# Patient Record
Sex: Female | Born: 1960
Health system: Southern US, Community
[De-identification: ages and names within clinical notes are randomized; demographics above are authoritative.]

## PROBLEM LIST (undated history)

## (undated) DIAGNOSIS — M1990 Unspecified osteoarthritis, unspecified site: Secondary | ICD-10-CM

## (undated) DIAGNOSIS — E7800 Pure hypercholesterolemia, unspecified: Secondary | ICD-10-CM

## (undated) DIAGNOSIS — D649 Anemia, unspecified: Secondary | ICD-10-CM

## (undated) DIAGNOSIS — F32A Depression, unspecified: Secondary | ICD-10-CM

## (undated) DIAGNOSIS — F329 Major depressive disorder, single episode, unspecified: Secondary | ICD-10-CM

## (undated) DIAGNOSIS — I10 Essential (primary) hypertension: Secondary | ICD-10-CM

## (undated) DIAGNOSIS — E559 Vitamin D deficiency, unspecified: Secondary | ICD-10-CM

## (undated) DIAGNOSIS — H409 Unspecified glaucoma: Secondary | ICD-10-CM

## (undated) HISTORY — PX: BREAST BIOPSY: SHX20

## (undated) HISTORY — DX: Pure hypercholesterolemia, unspecified: E78.00

## (undated) HISTORY — PX: SPINE SURGERY: SHX786

## (undated) HISTORY — DX: Unspecified osteoarthritis, unspecified site: M19.90

## (undated) HISTORY — DX: Unspecified glaucoma: H40.9

## (undated) HISTORY — DX: Vitamin D deficiency, unspecified: E55.9

## (undated) HISTORY — DX: Essential (primary) hypertension: I10

## (undated) HISTORY — DX: Anemia, unspecified: D64.9

## (undated) HISTORY — DX: Major depressive disorder, single episode, unspecified: F32.9

## (undated) HISTORY — DX: Depression, unspecified: F32.A

## (undated) HISTORY — PX: TUBAL LIGATION: SHX77

---

## 1998-10-09 ENCOUNTER — Other Ambulatory Visit: Admission: RE | Admit: 1998-10-09 | Discharge: 1998-10-09 | Payer: Self-pay | Admitting: Family Medicine

## 2000-10-12 ENCOUNTER — Other Ambulatory Visit: Admission: RE | Admit: 2000-10-12 | Discharge: 2000-10-12 | Payer: Self-pay | Admitting: Obstetrics and Gynecology

## 2000-10-19 ENCOUNTER — Encounter: Payer: Self-pay | Admitting: Obstetrics and Gynecology

## 2000-10-19 ENCOUNTER — Ambulatory Visit (HOSPITAL_COMMUNITY): Admission: RE | Admit: 2000-10-19 | Discharge: 2000-10-19 | Payer: Self-pay | Admitting: *Deleted

## 2002-01-04 ENCOUNTER — Ambulatory Visit (HOSPITAL_COMMUNITY): Admission: RE | Admit: 2002-01-04 | Discharge: 2002-01-04 | Payer: Self-pay | Admitting: Obstetrics and Gynecology

## 2002-01-04 ENCOUNTER — Encounter: Payer: Self-pay | Admitting: Obstetrics and Gynecology

## 2009-08-08 ENCOUNTER — Ambulatory Visit: Payer: Self-pay | Admitting: Cardiology

## 2010-09-23 LAB — COMPREHENSIVE METABOLIC PANEL
Albumin: 4.2
BUN: 16 mg/dL (ref 4–21)
Calcium: 9.6 mg/dL
Chloride: 105 mmol/L
Glucose: 97 mg/dL
Potassium: 4.3 mmol/L
Total Protein: 6.9 g/dL

## 2011-03-19 ENCOUNTER — Encounter: Payer: Self-pay | Admitting: Gastroenterology

## 2011-03-19 ENCOUNTER — Ambulatory Visit (INDEPENDENT_AMBULATORY_CARE_PROVIDER_SITE_OTHER): Payer: BC Managed Care – PPO | Admitting: Gastroenterology

## 2011-03-19 VITALS — BP 140/89 | HR 97 | Temp 97.6°F | Ht 66.0 in | Wt 156.2 lb

## 2011-03-19 DIAGNOSIS — R197 Diarrhea, unspecified: Secondary | ICD-10-CM | POA: Insufficient documentation

## 2011-03-19 DIAGNOSIS — K625 Hemorrhage of anus and rectum: Secondary | ICD-10-CM

## 2011-03-19 DIAGNOSIS — K219 Gastro-esophageal reflux disease without esophagitis: Secondary | ICD-10-CM

## 2011-03-19 MED ORDER — SOD PICOSULFATE-MAG OX-CIT ACD 10-3.5-12 MG-GM-GM PO PACK: 1.0000 | PACK | Freq: Once | ORAL | Status: DC

## 2011-03-19 MED ORDER — HYDROCORTISONE ACETATE 25 MG RE SUPP: 25.0000 mg | Freq: Two times a day (BID) | RECTAL | Status: DC

## 2011-03-19 MED ORDER — OMEPRAZOLE 20 MG PO CPDR: 20.0000 mg | DELAYED_RELEASE_CAPSULE | Freq: Every day | ORAL | Status: DC

## 2011-03-19 NOTE — Patient Instructions (Signed)
Start taking Prilosec 20 mg, 30 minutes before the first meal of the day. Samples have been provided, and a prescription has been sent to your pharmacy.  Please collect the stool studies and return to the lab as soon as you can. If you have any fever, chills, worsening abdominal pain, please call our office.   I have sent a prescription for 7 days of suppositories to the pharmacy to help with rectal discomfort.   We have set you up for a colonoscopy and a tentative upper endoscopy with Dr. Jena Gauss in the near future.   Further recommendations to follow.

## 2011-03-19 NOTE — Assessment & Plan Note (Signed)
51 year old female with several week hx of postprandial diarrhea, associated with hematochezia. Notes lower abdominal cramping prior to loose stools. No recent abx, sick contacts, change in medications. Notes intermittent rectal pruritis. No prior TCS, no FH of colon cancer. Doubt infectious process, but we will obtain Cdiff PCR and culture. Proceed with TCS in near future for lower GI tract evaluation. Likely hematochezia related to benign anorectal source, but unable to exclude other etiology at this time.   ~Proceed with TCS with Dr. Jena Gauss in near future: the risks, benefits, and alternatives have been discussed with the patient in detail. The patient states understanding and desires to proceed. ~Anusol suppositories provided ~Cdiff PCR, culture

## 2011-03-19 NOTE — Progress Notes (Signed)
Referring Provider: Edilia Bo, PA Primary Care Physician:  Monica Lanius, PA, PA Primary Gastroenterologist:  Dr. Jena Gauss   Chief Complaint  Patient presents with  . Rectal Bleeding    wt loss    HPI:   Monica Elliott is a very pleasant 51 year old female who presents today at the request of Edilia Bo, [place], secondary to diarrhea, rectal bleeding, and weight loss. She reports onset of symptoms 2-3 weeks ago, with postprandial loose stools about 30-45 minutes after eating. Notes blood in toilet. Underlying nausea noted, intermittent emesis but no hematemesis. Lower abdominal cramping after eating, preceding bowel movement. No fever, chills, sick contacts, recent abx. Has city water.  Also notes intermittent reflux, no dysphagia. Reports approximately 5 lbs of wt loss since Jan, unintentional. Still has good appetite. Rare NSAIDs.   No prior colonoscopy, no upper EGD.   Labs from August reviewed: no anemia, LFTs, TSH normal. To be abstracted.   Past Medical History  Diagnosis Date  . Hypertension   . Hypercholesterolemia   . Anemia     states low iron    Past Surgical History  Procedure Date  . None     Current Outpatient Prescriptions  Medication Sig Dispense Refill  . ALPRAZolam (XANAX) 0.5 MG tablet Take 0.5 mg by mouth at bedtime as needed.      . citalopram (CELEXA) 20 MG tablet Take 20 mg by mouth daily.      . ramipril (ALTACE) 10 MG capsule Take 10 mg by mouth daily.      . simvastatin (ZOCOR) 40 MG tablet Take 40 mg by mouth every evening.      . traZODone (DESYREL) 50 MG tablet Take 50 mg by mouth at bedtime.      . Vitamin D, Ergocalciferol, (DRISDOL) 50000 UNITS CAPS Take 50,000 Units by mouth.      . hydrocortisone (ANUSOL-HC) 25 MG suppository Place 1 suppository (25 mg total) rectally every 12 (twelve) hours.  14 suppository  0  . omeprazole (PRILOSEC) 20 MG capsule Take 1 capsule (20 mg total) by mouth daily.  30 capsule  1    Allergies as of 03/19/2011   . (No Known Allergies)    Family History  Problem Relation Age of Onset  . Colon cancer Neg Hx     History   Social History  . Marital Status: Single    Spouse Name: N/A    Number of Children: N/A  . Years of Education: N/A   Occupational History  . Not on file.   Social History Main Topics  . Smoking status: Current Everyday Smoker -- 1.0 packs/day    Types: Cigarettes  . Smokeless tobacco: Not on file  . Alcohol Use: Yes     a glass of wine every other night  . Drug Use: No  . Sexually Active: Not on file   Other Topics Concern  . Not on file   Social History Narrative  . No narrative on file    Review of Systems: Gen: SEE HPI CV: Denies chest pain, heart palpitations, syncope, peripheral edema. Resp: Denies shortness of breath with rest, cough, wheezing GI: Denies dysphagia or odynophagia. Denies hematemesis, fecal incontinence, or jaundice.  GU : Denies urinary burning, urinary frequency, urinary incontinence.  MS: Denies joint pain, muscle weakness, cramps, limited movement Derm: Denies rash, itching, dry skin Psych: Denies depression, anxiety, confusion or memory loss  Heme: Denies bruising, bleeding, and enlarged lymph nodes.  Physical Exam: BP 140/89  Pulse  97  Temp(Src) 97.6 F (36.4 C) (Temporal)  Ht 5\' 6"  (1.676 m)  Wt 156 lb 3.2 oz (70.852 kg)  BMI 25.21 kg/m2  LMP 02/17/2011 General:   Alert and oriented. Well-developed, well-nourished, pleasant and cooperative. Head:  Normocephalic and atraumatic. Eyes:  Conjunctiva pink, sclera clear, no icterus.   Conjunctiva pink. Ears:  Normal auditory acuity. Nose:  No deformity, discharge,  or lesions. Mouth:  No deformity or lesions, mucosa pink and moist.  Neck:  Supple, without mass or thyromegaly. Lungs:  Clear to auscultation bilaterally, without wheezing, rales, or rhonchi.  Heart:  S1, S2 present without murmurs noted.  Abdomen:  +BS, soft, non-tender and non-distended. Without mass or HSM.  No rebound or guarding. No hernias noted. Rectal:  Deferred  Msk:  Symmetrical without gross deformities. Normal posture. Pulses:  Normal pulses noted. Extremities:  Without clubbing or edema. Neurologic:  Alert and  oriented x4;  grossly normal neurologically. Skin:  Intact, warm and dry without significant lesions or rashes Cervical Nodes:  No significant cervical adenopathy. Psych:  Alert and cooperative. Normal mood and affect.

## 2011-03-19 NOTE — Progress Notes (Signed)
Addended by: Cherene Julian D on: 03/19/2011 09:01 AM   Modules accepted: Orders

## 2011-03-19 NOTE — Assessment & Plan Note (Signed)
See diarrhea. 

## 2011-03-19 NOTE — Assessment & Plan Note (Signed)
Intermittent reflux noted. No dysphagia. Underlying nausea for past several weeks, intermittent emesis but no hematemesis. Wt loss of 5 lbs since Jan, still has good appetite. No epigastric pain. Will place empirically on a PPI, proceed with possible EGD at time of TCS. Pt aware.   Proceed with possible upper endoscopy at time of TCSwith Dr. Jena Gauss. The risks, benefits, and alternatives have been discussed in detail with patient. They have stated understanding and desire to proceed.  Prilosec 20 mg daily, rx sent to pharmacy.

## 2011-03-19 NOTE — Progress Notes (Signed)
Faxed to PCP

## 2011-03-31 ENCOUNTER — Encounter (HOSPITAL_COMMUNITY): Payer: Self-pay | Admitting: Pharmacy Technician

## 2011-03-31 LAB — STOOL CULTURE

## 2011-04-03 ENCOUNTER — Telehealth: Payer: Self-pay | Admitting: Gastroenterology

## 2011-04-03 NOTE — Telephone Encounter (Signed)
LMOVM with new arrival and procedure times- she will need to check in at 1:30

## 2011-04-06 ENCOUNTER — Ambulatory Visit (HOSPITAL_COMMUNITY)
Admission: RE | Admit: 2011-04-06 | Discharge: 2011-04-06 | Disposition: A | Discharge status: Home / Self Care | Payer: BC Managed Care – PPO | Source: Ambulatory Visit | Attending: Internal Medicine | Admitting: Internal Medicine

## 2011-04-06 ENCOUNTER — Encounter (HOSPITAL_COMMUNITY)
Admission: RE | Disposition: A | Discharge status: Home / Self Care | Payer: Self-pay | Source: Ambulatory Visit | Attending: Internal Medicine

## 2011-04-06 ENCOUNTER — Encounter (HOSPITAL_COMMUNITY): Payer: Self-pay

## 2011-04-06 DIAGNOSIS — R197 Diarrhea, unspecified: Secondary | ICD-10-CM

## 2011-04-06 DIAGNOSIS — K625 Hemorrhage of anus and rectum: Secondary | ICD-10-CM

## 2011-04-06 DIAGNOSIS — R198 Other specified symptoms and signs involving the digestive system and abdomen: Secondary | ICD-10-CM

## 2011-04-06 DIAGNOSIS — Z79899 Other long term (current) drug therapy: Secondary | ICD-10-CM | POA: Insufficient documentation

## 2011-04-06 DIAGNOSIS — D126 Benign neoplasm of colon, unspecified: Secondary | ICD-10-CM

## 2011-04-06 DIAGNOSIS — K5730 Diverticulosis of large intestine without perforation or abscess without bleeding: Secondary | ICD-10-CM

## 2011-04-06 DIAGNOSIS — I10 Essential (primary) hypertension: Secondary | ICD-10-CM | POA: Insufficient documentation

## 2011-04-06 DIAGNOSIS — K219 Gastro-esophageal reflux disease without esophagitis: Secondary | ICD-10-CM

## 2011-04-06 DIAGNOSIS — E7800 Pure hypercholesterolemia, unspecified: Secondary | ICD-10-CM | POA: Insufficient documentation

## 2011-04-06 DIAGNOSIS — R112 Nausea with vomiting, unspecified: Secondary | ICD-10-CM | POA: Insufficient documentation

## 2011-04-06 HISTORY — PX: COLONOSCOPY: SHX5424

## 2011-04-06 SURGERY — COLONOSCOPY
Anesthesia: Moderate Sedation

## 2011-04-06 MED ORDER — MEPERIDINE HCL 100 MG/ML IJ SOLN
INTRAMUSCULAR | Status: DC | PRN
  Administered 2011-04-06 (×2): 25 mg via INTRAVENOUS
  Administered 2011-04-06: 50 mg via INTRAVENOUS

## 2011-04-06 MED ORDER — STERILE WATER FOR IRRIGATION IR SOLN
Status: DC | PRN
  Administered 2011-04-06: 16:00:00

## 2011-04-06 MED ORDER — MIDAZOLAM HCL 5 MG/5ML IJ SOLN
INTRAMUSCULAR | Status: DC | PRN
  Administered 2011-04-06 (×2): 1 mg via INTRAVENOUS
  Administered 2011-04-06: 2 mg via INTRAVENOUS
  Administered 2011-04-06: 1 mg via INTRAVENOUS

## 2011-04-06 MED ORDER — MEPERIDINE HCL 100 MG/ML IJ SOLN
INTRAMUSCULAR | Status: AC
  Filled 2011-04-06: qty 2

## 2011-04-06 MED ORDER — SODIUM CHLORIDE 0.45 % IV SOLN
Freq: Once | INTRAVENOUS | Status: AC
  Administered 2011-04-06: 15:00:00 via INTRAVENOUS

## 2011-04-06 MED ORDER — MIDAZOLAM HCL 5 MG/5ML IJ SOLN
INTRAMUSCULAR | Status: AC
  Filled 2011-04-06: qty 10

## 2011-04-06 NOTE — Op Note (Signed)
[facility] [address] [place], [place]  [address]  COLONOSCOPY PROCEDURE REPORT  PATIENT:  Monica, Elliott  MR#:  [id number] BIRTHDATE:  1960/02/08, 50 yrs. old  GENDER:  female ENDOSCOPIST:  R. Roetta Sessions, MD FACP [facility] REF. BY:          Edilia Bo, PA-C PROCEDURE DATE:  04/06/2011 PROCEDURE:  Diagnostic colonoscopy  INDICATIONS:  Recent loose stools and rectal bleeding. Also recent nausea and vomiting.  Patient says nausea and GERD symptoms now well controlled with recent addition of Prilosec. No alarm symptoms. Will not perform EGD today per plan since patient is doing much better from the upper GI tract standpoint. Stool culture and C. difficile came back negative  INFORMED CONSENT:  The risks, benefits, alternatives and imponderables including but not limited to bleeding, perforation as well as the possibility of a missed lesion have been reviewed. The potential for biopsy, lesion removal, etc. have also been discussed.  Questions have been answered.  All parties agreeable. Please see the history and physical in the medical record for more information.  MEDICATIONS:   Versed 5 mg IV and Demerol 100 mg IV in divided doses  DESCRIPTION OF PROCEDURE:  After a digital rectal exam was performed, the EC-[id number] ([id number]) colonoscope was advanced from the anus through the rectum and colon to the area of the cecum, ileocecal valve and appendiceal orifice.  The cecum was deeply intubated.  These structures were well-seen and photographed for the record.  From the level of the cecum and ileocecal valve, the scope was slowly and cautiously withdrawn.  The mucosal surfaces were carefully surveyed utilizing scope tip deflection to facilitate fold flattening as needed.  The scope was pulled down into the rectum where a thorough examination including retroflexion was performed. <<PROCEDUREIMAGES>>  FINDINGS: Adequate preparation. Normal rectum. Densely  populated pancolonic diverticulosis; remainder of colonic mucosa appeared normal except for a single diminutive polyp at the ileocecal valve and in the sigmoid colon. Both of these polyp were removed with cold biopsy forcep technique  COMPLICATIONS:  None  CECAL WITHDRAWAL TIME: 12 minutes  IMPRESSION:  Colonic diverticulosis and colonic polyps-removed as described above  RECOMMENDATIONS:  Polyp and diverticulosis information provided. Follow up on pathology. Office visit with [place] to reassess her symptoms in                                                 months  ______________________________ R. Roetta Sessions, MD Caleen Essex  CC:  n. eSIGNED:   R. Roetta Sessions at 04/06/2011 04:45 PM  [place], Vernona Rieger, [phone]

## 2011-04-06 NOTE — H&P (View-Only) (Signed)
 Referring Provider: Angie Jones, PA Primary Care Physician:  JONES, ANGIE LAVONNE, PA, PA Primary Gastroenterologist:  Dr. Rourk   Chief Complaint  Patient presents with  . Rectal Bleeding    wt loss    HPI:   Ms. Monica Elliott is a very pleasant 50-year-old female who presents today at the request of Angie Jones, PA, secondary to diarrhea, rectal bleeding, and weight loss. She reports onset of symptoms 2-3 weeks ago, with postprandial loose stools about 30-45 minutes after eating. Notes blood in toilet. Underlying nausea noted, intermittent emesis but no hematemesis. Lower abdominal cramping after eating, preceding bowel movement. No fever, chills, sick contacts, recent abx. Has city water.  Also notes intermittent reflux, no dysphagia. Reports approximately 5 lbs of wt loss since Jan, unintentional. Still has good appetite. Rare NSAIDs.   No prior colonoscopy, no upper EGD.   Labs from August reviewed: no anemia, LFTs, TSH normal. To be abstracted.   Past Medical History  Diagnosis Date  . Hypertension   . Hypercholesterolemia   . Anemia     states low iron    Past Surgical History  Procedure Date  . None     Current Outpatient Prescriptions  Medication Sig Dispense Refill  . ALPRAZolam (XANAX) 0.5 MG tablet Take 0.5 mg by mouth at bedtime as needed.      . citalopram (CELEXA) 20 MG tablet Take 20 mg by mouth daily.      . ramipril (ALTACE) 10 MG capsule Take 10 mg by mouth daily.      . simvastatin (ZOCOR) 40 MG tablet Take 40 mg by mouth every evening.      . traZODone (DESYREL) 50 MG tablet Take 50 mg by mouth at bedtime.      . Vitamin D, Ergocalciferol, (DRISDOL) 50000 UNITS CAPS Take 50,000 Units by mouth.      . hydrocortisone (ANUSOL-HC) 25 MG suppository Place 1 suppository (25 mg total) rectally every 12 (twelve) hours.  14 suppository  0  . omeprazole (PRILOSEC) 20 MG capsule Take 1 capsule (20 mg total) by mouth daily.  30 capsule  1    Allergies as of 03/19/2011   . (No Known Allergies)    Family History  Problem Relation Age of Onset  . Colon cancer Neg Hx     History   Social History  . Marital Status: Single    Spouse Name: N/A    Number of Children: N/A  . Years of Education: N/A   Occupational History  . Not on file.   Social History Main Topics  . Smoking status: Current Everyday Smoker -- 1.0 packs/day    Types: Cigarettes  . Smokeless tobacco: Not on file  . Alcohol Use: Yes     a glass of wine every other night  . Drug Use: No  . Sexually Active: Not on file   Other Topics Concern  . Not on file   Social History Narrative  . No narrative on file    Review of Systems: Gen: SEE HPI CV: Denies chest pain, heart palpitations, syncope, peripheral edema. Resp: Denies shortness of breath with rest, cough, wheezing GI: Denies dysphagia or odynophagia. Denies hematemesis, fecal incontinence, or jaundice.  GU : Denies urinary burning, urinary frequency, urinary incontinence.  MS: Denies joint pain, muscle weakness, cramps, limited movement Derm: Denies rash, itching, dry skin Psych: Denies depression, anxiety, confusion or memory loss  Heme: Denies bruising, bleeding, and enlarged lymph nodes.  Physical Exam: BP 140/89  Pulse   97  Temp(Src) 97.6 F (36.4 C) (Temporal)  Ht 5' 6" (1.676 m)  Wt 156 lb 3.2 oz (70.852 kg)  BMI 25.21 kg/m2  LMP 02/17/2011 General:   Alert and oriented. Well-developed, well-nourished, pleasant and cooperative. Head:  Normocephalic and atraumatic. Eyes:  Conjunctiva pink, sclera clear, no icterus.   Conjunctiva pink. Ears:  Normal auditory acuity. Nose:  No deformity, discharge,  or lesions. Mouth:  No deformity or lesions, mucosa pink and moist.  Neck:  Supple, without mass or thyromegaly. Lungs:  Clear to auscultation bilaterally, without wheezing, rales, or rhonchi.  Heart:  S1, S2 present without murmurs noted.  Abdomen:  +BS, soft, non-tender and non-distended. Without mass or HSM.  No rebound or guarding. No hernias noted. Rectal:  Deferred  Msk:  Symmetrical without gross deformities. Normal posture. Pulses:  Normal pulses noted. Extremities:  Without clubbing or edema. Neurologic:  Alert and  oriented x4;  grossly normal neurologically. Skin:  Intact, warm and dry without significant lesions or rashes Cervical Nodes:  No significant cervical adenopathy. Psych:  Alert and cooperative. Normal mood and affect.   

## 2011-04-06 NOTE — Discharge Instructions (Addendum)
Colonoscopy Discharge Instructions  Read the instructions outlined below and refer to this sheet in the next few weeks. These discharge instructions provide you with general information on caring for yourself after you leave the hospital. Your doctor may also give you specific instructions. While your treatment has been planned according to the most current medical practices available, unavoidable complications occasionally occur. If you have any problems or questions after discharge, call Dr. Jena Gauss at [phone]. ACTIVITY  You may resume your regular activity, but move at a slower pace for the next 24 hours.   Take frequent rest periods for the next 24 hours.   Walking will help get rid of the air and reduce the bloated feeling in your belly (abdomen).   No driving for 24 hours (because of the medicine (anesthesia) used during the test).    Do not sign any important legal documents or operate any machinery for 24 hours (because of the anesthesia used during the test).  NUTRITION  Drink plenty of fluids.   You may resume your normal diet as instructed by your doctor.   Begin with a light meal and progress to your normal diet. Heavy or fried foods are harder to digest and may make you feel sick to your stomach (nauseated).   Avoid alcoholic beverages for 24 hours or as instructed.  MEDICATIONS  You may resume your normal medications unless your doctor tells you otherwise.  WHAT YOU CAN EXPECT TODAY  Some feelings of bloating in the abdomen.   Passage of more gas than usual.   Spotting of blood in your stool or on the toilet paper.  IF YOU HAD POLYPS REMOVED DURING THE COLONOSCOPY:  No aspirin products for 7 days or as instructed.   No alcohol for 7 days or as instructed.   Eat a soft diet for the next 24 hours.  FINDING OUT THE RESULTS OF YOUR TEST Not all test results are available during your visit. If your test results are not back during the visit, make an appointment  with your caregiver to find out the results. Do not assume everything is normal if you have not heard from your caregiver or the medical facility. It is important for you to follow up on all of your test results.  SEEK IMMEDIATE MEDICAL ATTENTION IF:  You have more than a spotting of blood in your stool.   Your belly is swollen (abdominal distention).   You are nauseated or vomiting.   You have a temperature over 101.   You have abdominal pain or discomfort that is severe or gets worse throughout the day.     Diverticulosis information provided. Office visit with [place] in 3 months to see how you're doingHigh Fiber Diet A high fiber diet changes your normal diet to include more whole grains, legumes, fruits, and vegetables. Changes in the diet involve replacing refined carbohydrates with unrefined foods. The calorie level of the diet is essentially unchanged. The Dietary Reference Intake (recommended amount) for adult males is 38 g per day. For adult females, it is 25 g per day. Pregnant and lactating women should consume 28 g of fiber per day. Fiber is the intact part of a plant that is not broken down during digestion. Functional fiber is fiber that has been isolated from the plant to provide a beneficial effect in the body. PURPOSE  Increase stool bulk.   Ease and regulate bowel movements.   Lower cholesterol.  INDICATIONS THAT YOU NEED MORE FIBER  Constipation and  hemorrhoids.   Uncomplicated diverticulosis (intestine condition) and irritable bowel syndrome.   Weight management.   As a protective measure against hardening of the arteries (atherosclerosis), diabetes, and cancer.  NOTE OF CAUTION If you have a digestive or bowel problem, ask your caregiver for advice before adding high fiber foods to your diet. Some of the following medical problems are such that a high fiber diet should not be used without consulting your caregiver:  Acute diverticulitis (intestine infection).    Partial small bowel obstructions.   Complicated diverticular disease involving bleeding, rupture (perforation), or abscess (boil, furuncle).   Presence of autonomic neuropathy (nerve damage) or gastric paresis (stomach cannot empty itself).  GUIDELINES FOR INCREASING FIBER  Start adding fiber to the diet slowly. A gradual increase of about 5 more grams (2 slices of whole-wheat bread, 2 servings of most fruits or vegetables, or 1 bowl of high fiber cereal) per day is best. Too rapid an increase in fiber may result in constipation, flatulence, and bloating.   Drink enough water and fluids to keep your urine clear or pale yellow. Water, juice, or caffeine-free drinks are recommended. Not drinking enough fluid may cause constipation.   Eat a variety of high fiber foods rather than one type of fiber.   Try to increase your intake of fiber through using high fiber foods rather than fiber pills or supplements that contain small amounts of fiber.   The goal is to change the types of food eaten. Do not supplement your present diet with high fiber foods, but replace foods in your present diet.  INCLUDE A VARIETY OF FIBER SOURCES  Replace refined and processed grains with whole grains, canned fruits with fresh fruits, and incorporate other fiber sources. White rice, white breads, and most bakery goods contain little or no fiber.   Brown whole-grain rice, buckwheat oats, and many fruits and vegetables are all good sources of fiber. These include: broccoli, Brussels sprouts, cabbage, cauliflower, beets, sweet potatoes, white potatoes (skin on), carrots, tomatoes, eggplant, squash, berries, fresh fruits, and dried fruits.   Cereals appear to be the richest source of fiber. Cereal fiber is found in whole grains and bran. Bran is the fiber-rich outer coat of cereal grain, which is largely removed in refining. In whole-grain cereals, the bran remains. In breakfast cereals, the largest amount of fiber is  found in those with "bran" in their names. The fiber content is sometimes indicated on the label.   You may need to include additional fruits and vegetables each day.   In baking, for 1 cup white flour, you may use the following substitutions:   1 cup whole-wheat flour minus 2 tbs.    cup white flour plus  cup whole-wheat flour.  Document Released: 01/19/2005 Document Revised: 01/08/2011 Document Reviewed: 11/27/2008 [facility] Patient Information 2012 [place], [place].Diverticulosis Diverticulosis is a common condition that develops when small pouches (diverticula) form in the wall of the colon. The risk of diverticulosis increases with age. It happens more often in people who eat a low-fiber diet. Most individuals with diverticulosis have no symptoms. Those individuals with symptoms usually experience abdominal pain, constipation, or loose stools (diarrhea). HOME CARE INSTRUCTIONS   Increase the amount of fiber in your diet as directed by your caregiver or dietician. This may reduce symptoms of diverticulosis.   Your caregiver may recommend taking a dietary fiber supplement.   Drink at least 6 to 8 glasses of water each day to prevent constipation.   Try not to strain  when you have a bowel movement.   Your caregiver may recommend avoiding nuts and seeds to prevent complications, although this is still an uncertain benefit.   Only take over-the-counter or prescription medicines for pain, discomfort, or fever as directed by your caregiver.  FOODS WITH HIGH FIBER CONTENT INCLUDE:  Fruits. Apple, peach, pear, tangerine, raisins, prunes.   Vegetables. Brussels sprouts, asparagus, broccoli, cabbage, carrot, cauliflower, romaine lettuce, spinach, summer squash, tomato, winter squash, zucchini.   Starchy Vegetables. Baked beans, kidney beans, lima beans, split peas, lentils, potatoes (with skin).   Grains. Whole wheat bread, brown rice, bran flake cereal, plain oatmeal, white rice, shredded  wheat, bran muffins.  SEEK IMMEDIATE MEDICAL CARE IF:   You develop increasing pain or severe bloating.   You have an oral temperature above 102 F (38.9 C), not controlled by medicine.   You develop vomiting or bowel movements that are bloody or black.   Diverticulosis and polyp information provided.  Office visit with [place] in 3 months. Document Released: 10/17/2003 Document Revised: 01/08/2011 Document Reviewed: 06/19/2009 [facility] Patient Information 2012 [place], [place].

## 2011-04-06 NOTE — Interval H&P Note (Signed)
History and Physical Interval Note:  04/06/2011 3:50 PM  Jennilee Demarco Xu  has presented today for surgery, with the diagnosis of rectal bleeding, wt loss  The various methods of treatment have been discussed with the patient and family. After consideration of risks, benefits and other options for treatment, the patient has consented to  Procedure(s) (LRB): COLONOSCOPY (N/A) ESOPHAGOGASTRODUODENOSCOPY (EGD) (N/A) as a surgical intervention .  The patients' history has been reviewed, patient examined, no change in status, stable for surgery.  I have reviewed the patients' chart and labs.  Questions were answered to the patient's satisfaction.     Eula Listen

## 2011-04-07 NOTE — Progress Notes (Signed)
Quick Note:  Negative.  How is pt doing since colonoscopy? ______

## 2011-04-08 ENCOUNTER — Encounter: Payer: Self-pay | Admitting: Internal Medicine

## 2011-04-08 NOTE — Progress Notes (Signed)
Quick Note:  Tried to call pt- LMOM ______ 

## 2011-04-09 NOTE — Progress Notes (Signed)
Quick Note:  Great. She will follow-up with Verlon Au in may. ______

## 2011-04-10 ENCOUNTER — Encounter (HOSPITAL_COMMUNITY): Payer: Self-pay | Admitting: Internal Medicine

## 2011-05-27 LAB — CBC WITH DIFFERENTIAL/PLATELET
HCT: 40 %
Hemoglobin: 13.5 g/dL (ref 12.0–16.0)
MCHC: 33.7
MCV: 92.1 fL
WBC: 5.8

## 2011-06-08 ENCOUNTER — Encounter: Payer: Self-pay | Admitting: Internal Medicine

## 2011-06-09 ENCOUNTER — Ambulatory Visit (INDEPENDENT_AMBULATORY_CARE_PROVIDER_SITE_OTHER): Payer: BC Managed Care – PPO | Admitting: Gastroenterology

## 2011-06-09 ENCOUNTER — Encounter: Payer: Self-pay | Admitting: Gastroenterology

## 2011-06-09 VITALS — BP 151/94 | HR 89 | Temp 97.2°F | Ht 67.0 in | Wt 149.0 lb

## 2011-06-09 DIAGNOSIS — K529 Noninfective gastroenteritis and colitis, unspecified: Secondary | ICD-10-CM | POA: Insufficient documentation

## 2011-06-09 DIAGNOSIS — R634 Abnormal weight loss: Secondary | ICD-10-CM

## 2011-06-09 DIAGNOSIS — R197 Diarrhea, unspecified: Secondary | ICD-10-CM

## 2011-06-09 MED ORDER — DICYCLOMINE HCL 10 MG PO CAPS: 10.0000 mg | ORAL_CAPSULE | Freq: Three times a day (TID) | ORAL | Status: DC

## 2011-06-09 NOTE — Patient Instructions (Signed)
Please take bentyl at least twice daily for diarrhea. Usually works best if you take 30 minutes before a meal. May take up to four times daily. Get your blood work done. Call in two weeks with progress report.   Chronic Diarrhea Diarrhea is loose, watery stools. Having diarrhea means passing loose stools 3 or more times a day. Diarrhea that lasts longer than 4 weeks is considered long-lasting (chronic). Symptoms of chronic diarrhea may be continual or may come and go. People of all ages can get diarrhea. Body fluid loss (dehydration) may occur as a result of diarrhea. This means the body does not have as many fluids and salts (electrolytes) as it needs. CAUSES  There are many causes of chronic diarrhea. Causes may be different for children and adults. The various causes can be grouped into 2 categories: diarrhea caused by an infection and diarrhea not caused by an infection. Sometimes, the cause is unknown. Diarrhea caused by an infection may result from:  Parasites.   Bacteria.   Viral infections.  Diarrhea not caused by an infection may result from:  Irritable bowel syndrome.   Reaction to medicines, such as antibiotics, cancer drugs, blood pressure medicines, and antacids.   Intestinal disease (Crohn's disease, ulcerative colitis, celiac disease).   Food allergies or sensitivity to additives (fructose, lactose, sugar substitutes).   Tumors.   Diabetes, thyroid disease, and other endocrine diseases.   Reduced blood flow to the intestine.   Previous surgery or radiation of the abdomen or gastrointestinal tract.  Risk factors for chronic diarrhea include:  Having a severely weakened immune system, such as from HIV/AIDS.   Taking certain types of cancer-fighting drugs (chemotherapy) or other medicines.   A recent organ transplant.   Having a portion of the stomach removed.   Traveling to countries where food and water supplies are often contaminated.  SYMPTOMS  In addition  to frequent, loose stools, diarrhea may cause:  Cramping.   Abdominal pain.   Nausea.   Urgent need to use the bathroom, or loss of bowel control.  If dehydration occurs, problems include:  Thirst.   Less frequent urination.   Dark urine.   Dry skin.   Fatigue.   Dizziness.  Infections that cause diarrhea may also cause a fever, chills, or bloody stools. DIAGNOSIS  Diagnosis may be difficult. Your caregiver must take a careful history and perform a physical exam. Tests given are based on your symptoms and history. Tests may include:  Blood or stool tests, in which 3 or more stool samples may be examined. Stool cultures may be used to test for bacteria or parasites.   X-rays.   A procedure in which a thin tube is inserted into the mouth or rectum (endoscopy). This allows the caregiver to look inside the intestine.  TREATMENT   Diarrhea caused by an infection can often be treated with antibiotics.   Diarrhea not caused by an infection is more difficult to diagnose and treat. Long-term medicine use or surgery may be required. Specific treatment should be discussed with your caregiver.   If the cause cannot determined, treatment to relieve symptoms includes:   Preventing dehydration. Serious health problems can occur if you do not maintain proper fluid levels. Many oral rehydration solutions (ORS) are available at drug stores. Ask your caregiver what product is best for you.   Not drinking beverages that contain caffeine (tea, coffee, soft drinks).   Not drinking alcohol. It causes dehydration.   Not relying on sports drinks  and broths alone to maintain proper fluid levels. They should not be used to prevent severe dehydration.   Maintaining well-balanced nutrition. This may help you recover faster.  PREVENTION   Drink clean or purified water.   Use proper food handling techniques.   Maintain proper hand-washing habits.  HOME CARE INSTRUCTIONS   Avoid:    Caffeine.   Greasy foods.   High fiber.   If you have problems digesting lactose during or after an episode of diarrhea, you might want to try yogurt. Yogurt is often better tolerated, because it has less lactose than milk. Yogurt with active, live bacterial cultures may even help you recover faster.  SEEK MEDICAL CARE IF:  The person with diarrhea is an otherwise healthy adult and has:  Signs of dehydration.   Diarrhea for more than 2 days.   Severe pain in the abdomen or rectum.   An oral temperature above 102 F (38.9 C).   Stools containing blood or pus.   Stools that are black and tarry.  SEEK IMMEDIATE MEDICAL CARE IF:  The person with diarrhea is a child, elderly person, or has a weakened immune system and has:  Signs of dehydration.   Diarrhea for more than 1 day.   Severe pain in the abdomen or rectum.   An oral temperature above 102 F (38.9 C), not controlled by medicine.   Stools containing blood or pus.   Stools that are black and tarry.  Document Released: 04/11/2003 Document Revised: 01/08/2011 Document Reviewed: 06/07/2009 [facility] Patient Information 2012 [place], [place].

## 2011-06-09 NOTE — Progress Notes (Signed)
Faxed to PCP

## 2011-06-09 NOTE — Assessment & Plan Note (Addendum)
Four month h/o diarrhea with associated weight loss. We need to rule out celiac disease. If serologies are negative, would consider flex sig with biopsies to rule out microscopic colitis and/or send stool for giardia.   Add bentyl. PR in two weeks.

## 2011-06-09 NOTE — Progress Notes (Signed)
Primary Care Physician: Lajean Saver, NT, Nursing/ Tech I  Primary Gastroenterologist:  Roetta Sessions, MD   Chief Complaint  Patient presents with  . Follow-up    still has diarrhea at times.     HPI: Monica Elliott is a 51 y.o. female here for f/u of diarrhea. Seen in 03/2011. Had TCS in 04/2011, see below for findings.  Several months of pp loose stools. Begin back in 02/2011. Stool culture, CDiff negative at that time. TSH, CBC normal. Patient taking digestive enzymes which help stool consistency. Still having 4+ stool daily and nocturnal BMs at times. Bleeding has cleared up. Heartburn doing okay. Weight down from 163 to 149. Last OV in 03/2011, weight was 156. Menstrual cycles different last month, more of spotting for several weeks, usually heavy menses. Denies abdominal pain, dysuria.     Current Outpatient Prescriptions  Medication Sig Dispense Refill  . ALPRAZolam (XANAX) 0.5 MG tablet Take 0.5 mg by mouth at bedtime as needed. For sleep      . Cholecalciferol (VITAMIN D PO) Take 1 tablet by mouth daily.      . citalopram (CELEXA) 20 MG tablet Take 20 mg by mouth daily.      Marland Kitchen DIGESTIVE ENZYMES PO Take 4 tablets by mouth daily.      . IRON PO Take 1 tablet by mouth daily.      . naproxen sodium (ANAPROX) 220 MG tablet Take 220 mg by mouth every 8 (eight) hours as needed. For pain      . omeprazole (PRILOSEC) 20 MG capsule Take 1 capsule (20 mg total) by mouth daily.  30 capsule  1  . ramipril (ALTACE) 10 MG capsule Take 10 mg by mouth daily.      . simvastatin (ZOCOR) 40 MG tablet Take 40 mg by mouth every evening.      . traZODone (DESYREL) 50 MG tablet Take 50 mg by mouth at bedtime.        Allergies as of 06/09/2011  . (No Known Allergies)   Past Surgical History  Procedure Date  . Tubal ligation   . Colonoscopy 04/06/2011    Colonic diverticulosis and colonic polyps-removed as described above. Polyps benign. Next colonoscopy 04/2021.     ROS:  General: Negative for  anorexia, fever, chills, fatigue, weakness. See hpi for wt loss. ENT: Negative for hoarseness, difficulty swallowing , nasal congestion. CV: Negative for chest pain, angina, palpitations, dyspnea on exertion, peripheral edema.  Respiratory: Negative for dyspnea at rest, dyspnea on exertion, cough, sputum, wheezing.  GI: See history of present illness. GU:  Negative for dysuria, hematuria, urinary incontinence, urinary frequency, nocturnal urination.  Endo: see hpi for wt loss   Physical Examination:   Pulse 89  Temp(Src) 97.2 F (36.2 C) (Temporal)  Ht 5\' 7"  (1.702 m)  Wt 149 lb (67.586 kg)  BMI 23.34 kg/m2  LMP 05/10/2011  General: Well-nourished, well-developed in no acute distress.  Eyes: No icterus. Mouth: Oropharyngeal mucosa moist and pink , no lesions erythema or exudate. Lungs: Clear to auscultation bilaterally.  Heart: Regular rate and rhythm, no murmurs rubs or gallops.  Abdomen: Bowel sounds are normal, nontender, nondistended, no hepatosplenomegaly or masses, no abdominal bruits or hernia , no rebound or guarding.   Extremities: No lower extremity edema. No clubbing or deformities. Neuro: Alert and oriented x 4   Skin: Warm and dry, no jaundice.   Psych: Alert and cooperative, normal mood and affect.

## 2011-06-15 LAB — TISSUE TRANSGLUTAMINASE, IGA: Tissue Transglutaminase Ab, IgA: 3.5 U/mL (ref <20)

## 2011-06-17 NOTE — Progress Notes (Signed)
Quick Note:    noted  ______

## 2011-06-17 NOTE — Progress Notes (Signed)
Quick Note:  No evidence of celiac disease. How is she doing on bentyl? If persistent diarrhea, recommend stool for giardia prior to consider flex sig with bx. ______

## 2014-02-02 HISTORY — PX: BACK SURGERY: SHX140

## 2014-07-31 ENCOUNTER — Encounter: Payer: Self-pay | Admitting: Physical Therapy

## 2014-07-31 ENCOUNTER — Ambulatory Visit: Payer: 59 | Attending: Physician Assistant | Admitting: Physical Therapy

## 2014-07-31 DIAGNOSIS — M5416 Radiculopathy, lumbar region: Secondary | ICD-10-CM | POA: Insufficient documentation

## 2014-07-31 DIAGNOSIS — R531 Weakness: Secondary | ICD-10-CM | POA: Insufficient documentation

## 2014-07-31 DIAGNOSIS — Z9889 Other specified postprocedural states: Secondary | ICD-10-CM | POA: Diagnosis not present

## 2014-07-31 DIAGNOSIS — M5126 Other intervertebral disc displacement, lumbar region: Secondary | ICD-10-CM | POA: Insufficient documentation

## 2014-07-31 NOTE — Patient Instructions (Signed)
  Quadriceps (Prone)   On stomach with sheet around ankles, knees together, hips down, pull heels toward bottom. Keep hips flat. Hold _30___ seconds. Repeat __3__ times. Do _2___ sessions per day. CAUTION: Stretch should be gentle, steady and slow.  Copyright  VHI. All rights reserved.   Hip Flexion / Knee Extension: Straight-Leg Raise (Eccentric)   Lie on back. Lift leg with knee straight. Slowly lower leg for 3-5 seconds. __10_ reps per set, __1-3_ sets per day  ABDUCTION: Side-Lying (Active)   Lie on left side, top leg straight. Raise top leg as far as possible. Use 0___ lbs. Complete _1-3__ sets of _10__ repetitions. Perform _1__ sessions per day.  http://gtsc.exer.us/94   (Home) Extension: Hip   With support under abdomen, tighten stomach. Lift right leg in line with body. Do not hyperextend. Alternate legs. Repeat __10__ times per set. Do __1-3__ sets per session. Do __1__ sessions per day.  Madelyn Flavors, PT 07/31/2014 11:09 AM [facility] [address] [place], [place], [address] Phone: [phone]   Fax:  [phone]

## 2014-07-31 NOTE — Therapy (Signed)
[facility] [place], [place], [address] Phone: [phone]   Fax:  [phone]  Physical Therapy Evaluation  Patient Details  Name: Monica Elliott MRN: [id number] Date of Birth: 03/30/1960 Referring Provider:  Terald Sleeper, PA-C  Encounter Date: 07/31/2014      PT End of Session - 07/31/14 1112    Visit Number 1   Number of Visits 8   Date for PT Re-Evaluation 08/28/14   PT Start Time 8366   PT Stop Time 1112   PT Time Calculation (min) 35 min   Activity Tolerance Patient tolerated treatment well   Behavior During Therapy [facility] for tasks assessed/performed      Past Medical History  Diagnosis Date  . Hypertension   . Hypercholesterolemia   . Anemia     states low iron    Past Surgical History  Procedure Laterality Date  . Tubal ligation    . Colonoscopy  04/06/2011    Colonic diverticulosis and colonic polyps-removed as described above. Polyps benign. Next colonoscopy 04/2021.    There were no vitals filed for this visit.  Visit Diagnosis:  Generalized weakness - Plan: PT plan of care cert/re-cert      Subjective Assessment - 07/31/14 1044    Subjective Patient had surgery in March 26, 2014 for ruptured disc. She reports only minor intermittent pain with back but still has weakness in her right leg. Also reports intermittent tingling and numbness in her right foot, mainly when barefoot.    Patient Stated Goals to get stronger   Currently in Pain? No/denies            [facility] PT Assessment - 07/31/14 0001    Assessment   Medical Diagnosis lumbar nerve root impingement; disc displacement   Onset Date/Surgical Date 03/26/14   Next MD Visit 09/24/14   Precautions   Precaution Comments 15# weight lifting restriction   Restrictions   Weight Bearing Restrictions No   Balance Screen   Has the patient fallen in the past 6 months No   Has the patient had a decrease in activity level because of a fear of  falling?  No   Is the patient reluctant to leave their home because of a fear of falling?  No   Prior Function   Level of Independence Independent   Vocation Full time employment  cleared for light duty   Vocation Requirements standing  works in [place] / Strength   AROM / PROM / Strength AROM;Strength   AROM   Overall AROM Comments Lumbar WFL except flex 18" from floor and left rot decreased 30%.   Strength   Overall Strength Comments Left hip flex, ext and ABD  4-/5 bil, bil knee flex 4+/5, ext 5/5; DF 5/5   Flexibility   Soft Tissue Assessment /Muscle Length --  tight HF bil   Palpation   Palpation comment mild tenderness right lumbar muscles                           PT Education - 07/31/14 1222    Education provided Yes   Education Details prone quad stretch, 3 way SLR lying down   Person(s) Educated Patient   Methods Explanation;Demonstration;Handout   Comprehension Verbalized understanding;Returned demonstration             PT Long Term Goals - 07/31/14 1227    PT LONG TERM GOAL #1  Title I with HEP   Time 4   Period Weeks   Status New   PT LONG TERM GOAL #2   Title improved BLE strength to 4+/5 or better   Time 4   Period Weeks   PT LONG TERM GOAL #3   Title demo and verbalize understanding of correct body mechanics to prevent further injury to back   Time 4   Period Weeks   Status New   PT LONG TERM GOAL #4   Title demo good core (abdominal) control with therex in clinic   Time 4   Period Weeks   Status New               Plan - 07/31/14 1223    Clinical Impression Statement Patient is a 54 year old female who underwent lumbar discectomy 03/26/14. She now has minimal pain, but weakness in BLE and core. Patient works at [employer] and needs to improve her strength to return to work. She also reports intermittent N/T in right foot.    Pt will benefit from skilled therapeutic intervention in order to improve on  the following deficits Decreased range of motion;Decreased activity tolerance;Pain;Impaired flexibility;Decreased strength   Rehab Potential Excellent   PT Frequency 2x / week   PT Duration 4 weeks   PT Treatment/Interventions ADLs/Self Care Home Management;Cryotherapy;Electrical Stimulation;Moist Heat;Therapeutic exercise;Neuromuscular re-education;Patient/family education;Manual techniques;Passive range of motion   PT Next Visit Plan core and BLE strengthening   Consulted and Agree with Plan of Care Patient         Problem List Patient Active Problem List   Diagnosis Date Noted  . Abnormal weight loss 06/09/2011  . Chronic diarrhea 06/09/2011  . Diarrhea 03/19/2011  . Rectal bleeding 03/19/2011  . GERD (gastroesophageal reflux disease) 03/19/2011    Madelyn Flavors PT  07/31/2014, 12:33 PM  [facility] [address] [place], [place], [address] Phone: [phone]   Fax:  [phone]

## 2014-08-03 ENCOUNTER — Ambulatory Visit: Payer: 59 | Attending: Physician Assistant | Admitting: Physical Therapy

## 2014-08-03 ENCOUNTER — Encounter: Payer: Self-pay | Admitting: Physical Therapy

## 2014-08-03 DIAGNOSIS — R531 Weakness: Secondary | ICD-10-CM | POA: Insufficient documentation

## 2014-08-03 NOTE — Therapy (Signed)
[facility] [place], [place], [address] Phone: [phone]   Fax:  [phone]  Physical Therapy Treatment  Patient Details  Name: Monica Elliott MRN: [id number] Date of Birth: 1961/01/05 Referring Provider:  Terald Sleeper, PA-C  Encounter Date: 08/03/2014      PT End of Session - 08/03/14 1033    Visit Number 2   Number of Visits 8   Date for PT Re-Evaluation 08/28/14   PT Start Time 1031   PT Stop Time 1114   PT Time Calculation (min) 43 min   Activity Tolerance Patient tolerated treatment well   Behavior During Therapy [facility] for tasks assessed/performed      Past Medical History  Diagnosis Date  . Hypertension   . Hypercholesterolemia   . Anemia     states low iron    Past Surgical History  Procedure Laterality Date  . Tubal ligation    . Colonoscopy  04/06/2011    Colonic diverticulosis and colonic polyps-removed as described above. Polyps benign. Next colonoscopy 04/2021.    There were no vitals filed for this visit.  Visit Diagnosis:  Generalized weakness      Subjective Assessment - 08/03/14 1033    Subjective States that her RLE is the worst of her LEs.   Patient Stated Goals to get stronger   Currently in Pain? No/denies            [facility] PT Assessment - 08/03/14 0001    Assessment   Medical Diagnosis lumbar nerve root impingement; disc displacement   Onset Date/Surgical Date 03/26/14   Next MD Visit 09/24/14                     [facility] Adult PT Treatment/Exercise - 08/03/14 0001    Exercises   Exercises Knee/Hip   Knee/Hip Exercises: Aerobic   Nustep L4 x16 min   Knee/Hip Exercises: Standing   Wall Squat 2 sets;10 reps;Other (comment)  With verbal cueing for core activation   Other Standing Knee Exercises 4 way XTS walks x5 reps each; Pink   Knee/Hip Exercises: Seated   Long Arc Quad Strengthening;Both;3 sets;10 reps;Weights  1 set 10 reps 3 #, 2 sets 10 reps 5#   Long  Arc Quad Weight 5 lbs.   Knee/Hip Exercises: Supine   Bridges Strengthening;Both;3 sets;10 reps   Straight Leg Raises Strengthening;Both;3 sets;10 reps  with verbal cueing for core activation   Knee/Hip Exercises: Sidelying   Hip ABduction Strengthening;Both;3 sets;10 reps  3#   Knee/Hip Exercises: Prone   Hamstring Curl 3 sets;10 reps;Other (comment)  2#   Hip Extension Strengthening;Both;3 sets;10 reps                     PT Long Term Goals - 08/03/14 1101    PT LONG TERM GOAL #1   Title I with HEP   Time 4   Period Weeks   Status Achieved   PT LONG TERM GOAL #2   Title improved BLE strength to 4+/5 or better   Time 4   Period Weeks   Status On-going   PT LONG TERM GOAL #3   Title demo and verbalize understanding of correct body mechanics to prevent further injury to back   Time 4   Period Weeks   Status On-going   PT LONG TERM GOAL #4   Title demo good core (abdominal) control with therex in clinic   Time 4   Period  Weeks   Status On-going               Plan - 08/03/14 1117    Clinical Impression Statement Patient tolerated treatment well today without complaint of pain. Demonstrates greater difficulty with R hip abduction, although patient reported the L SLR was more difficult than R SLR. Achieved HEP goal today in clinic. All others goal remain on-going secondary to decreased pain. Denied pain following treatment only fatigue.   Pt will benefit from skilled therapeutic intervention in order to improve on the following deficits Decreased range of motion;Decreased activity tolerance;Pain;Impaired flexibility;Decreased strength   Rehab Potential Excellent   PT Frequency 2x / week   PT Duration 4 weeks   PT Treatment/Interventions ADLs/Self Care Home Management;Cryotherapy;Electrical Stimulation;Moist Heat;Therapeutic exercise;Neuromuscular re-education;Patient/family education;Manual techniques;Passive range of motion   PT Next Visit Plan Continue  POC with core and BLE strengthening.   Consulted and Agree with Plan of Care Patient        Problem List Patient Active Problem List   Diagnosis Date Noted  . Abnormal weight loss 06/09/2011  . Chronic diarrhea 06/09/2011  . Diarrhea 03/19/2011  . Rectal bleeding 03/19/2011  . GERD (gastroesophageal reflux disease) 03/19/2011    Wynelle Fanny, PTA 08/03/2014, 11:21 AM  [facility] [address] [place], [place], [address] Phone: [phone]   Fax:  [phone]

## 2014-08-08 ENCOUNTER — Ambulatory Visit: Payer: 59 | Admitting: Physical Therapy

## 2014-08-08 ENCOUNTER — Encounter: Payer: Self-pay | Admitting: Physical Therapy

## 2014-08-08 DIAGNOSIS — R531 Weakness: Secondary | ICD-10-CM

## 2014-08-08 NOTE — Therapy (Signed)
[facility] [place], [place], [address] Phone: [phone]   Fax:  [phone]  Physical Therapy Treatment  Patient Details  Name: Monica Elliott MRN: [id number] Date of Birth: [birth year] Referring Provider:  Terald Sleeper, PA-C  Encounter Date: 08/08/2014      PT End of Session - 08/08/14 1021    Visit Number 3   Number of Visits 8   Date for PT Re-Evaluation 08/28/14   PT Start Time 0944   PT Stop Time 1028   PT Time Calculation (min) 44 min   Activity Tolerance Patient tolerated treatment well   Behavior During Therapy [facility] for tasks assessed/performed      Past Medical History  Diagnosis Date  . Hypertension   . Hypercholesterolemia   . Anemia     states low iron    Past Surgical History  Procedure Laterality Date  . Tubal ligation    . Colonoscopy  04/06/2011    Colonic diverticulosis and colonic polyps-removed as described above. Polyps benign. Next colonoscopy 04/2021.    There were no vitals filed for this visit.  Visit Diagnosis:  Generalized weakness      Subjective Assessment - 08/08/14 1003    Subjective States that her RLE is the worst of her LEs.   Patient Stated Goals to get stronger   Currently in Pain? No/denies                         [facility] Adult PT Treatment/Exercise - 08/08/14 0001    Exercises   Exercises Lumbar   Lumbar Exercises: Machines for Strengthening   Cybex Knee Extension 10# 3x10   Cybex Knee Flexion 20# 3x10   Lumbar Exercises: Standing   Other Standing Lumbar Exercises scap retractions/ext with pink XTS draw in focus   Lumbar Exercises: Supine   Bent Knee Raise 20 reps;3 seconds  with core activation   Bridge 20 reps   Straight Leg Raise 20 reps;3 seconds  with core activation   Knee/Hip Exercises: Aerobic   Nustep L5 x 73mn with posture focus   Knee/Hip Exercises: Standing   Lateral Step Up Both;2 sets;10 reps   Forward Step Up Both;2 sets;10  reps                     PT Long Term Goals - 08/08/14 1022    PT LONG TERM GOAL #1   Title I with HEP   Time 4   Period Weeks   Status Achieved   PT LONG TERM GOAL #2   Title improved BLE strength to 4+/5 or better   Time 4   Period Weeks   Status On-going   PT LONG TERM GOAL #3   Title demo and verbalize understanding of correct body mechanics to prevent further injury to back   Time 4   Period Weeks   Status Achieved   PT LONG TERM GOAL #4   Title demo good core (abdominal) control with therex in clinic   Time 4   Period Weeks   Status On-going               Plan - 08/08/14 1024    Clinical Impression Statement Patient continues to progress with all activies and has a very good understanding of posture awareness techniques in all positions. Patient tolerated treatment well today and performed exercises with good core activation. Met LTG #3 today, others ongoing due to LE  strength deficits.   Pt will benefit from skilled therapeutic intervention in order to improve on the following deficits Decreased range of motion;Decreased activity tolerance;Pain;Impaired flexibility;Decreased strength   Rehab Potential Excellent   PT Frequency 2x / week   PT Duration 4 weeks   PT Treatment/Interventions ADLs/Self Care Home Management;Cryotherapy;Electrical Stimulation;Moist Heat;Therapeutic exercise;Neuromuscular re-education;Patient/family education;Manual techniques;Passive range of motion   PT Next Visit Plan Continue POC with core and BLE strengthening.   Consulted and Agree with Plan of Care Patient        Problem List Patient Active Problem List   Diagnosis Date Noted  . Abnormal weight loss 06/09/2011  . Chronic diarrhea 06/09/2011  . Diarrhea 03/19/2011  . Rectal bleeding 03/19/2011  . GERD (gastroesophageal reflux disease) 03/19/2011    DUNFORD, CHRISTINA P, PTA 08/08/2014, 10:30 AM  [facility] [place], [place], [address] Phone: [phone]   Fax:  [phone]

## 2014-08-10 ENCOUNTER — Encounter: Payer: Self-pay | Admitting: Physical Therapy

## 2014-08-10 ENCOUNTER — Ambulatory Visit: Payer: 59 | Admitting: Physical Therapy

## 2014-08-10 DIAGNOSIS — R531 Weakness: Secondary | ICD-10-CM | POA: Diagnosis not present

## 2014-08-10 NOTE — Therapy (Signed)
[facility] [place], [place], [address] Phone: [phone]   Fax:  [phone]  Physical Therapy Treatment  Patient Details  Name: Monica Elliott Barnhard MRN: [id number] Date of Birth: 24-Apr-1960 Referring Provider:  Terald Sleeper, PA-C  Encounter Date: 08/10/2014      PT End of Session - 08/10/14 1024    Visit Number 4   Number of Visits 8   Date for PT Re-Evaluation 08/28/14   PT Start Time 0946   PT Stop Time 1027   PT Time Calculation (min) 41 min   Activity Tolerance Patient tolerated treatment well   Behavior During Therapy [facility] for tasks assessed/performed      Past Medical History  Diagnosis Date  . Hypertension   . Hypercholesterolemia   . Anemia     states low iron    Past Surgical History  Procedure Laterality Date  . Tubal ligation    . Colonoscopy  04/06/2011    Colonic diverticulosis and colonic polyps-removed as described above. Polyps benign. Next colonoscopy 04/2021.    There were no vitals filed for this visit.  Visit Diagnosis:  Generalized weakness      Subjective Assessment - 08/10/14 0948    Subjective States that her RLE is the worst of her LEs/ no complaints after last treatment   Patient Stated Goals to get stronger   Currently in Pain? No/denies                         [facility] Adult PT Treatment/Exercise - 08/10/14 0001    Lumbar Exercises: Standing   Other Standing Lumbar Exercises scap retractions/ext with pink XTS draw in focus   Knee/Hip Exercises: Aerobic   Elliptical L1 R1 x 2 1/68min   Nustep L5 x 65min with posture focus   Knee/Hip Exercises: Machines for Strengthening   Cybex Knee Extension 10# 3x10   Cybex Knee Flexion 30# 3x10   Knee/Hip Exercises: Standing   Lateral Step Up Both;2 sets;10 reps   Forward Step Up Both;2 sets;10 reps   Knee/Hip Exercises: Sidelying   Hip ABduction Strengthening;Both;2 sets;10 reps   Clams 4# 2x10 each LE                      PT Long Term Goals - 08/08/14 1022    PT LONG TERM GOAL #1   Title I with HEP   Time 4   Period Weeks   Status Achieved   PT LONG TERM GOAL #2   Title improved BLE strength to 4+/5 or better   Time 4   Period Weeks   Status On-going   PT LONG TERM GOAL #3   Title demo and verbalize understanding of correct body mechanics to prevent further injury to back   Time 4   Period Weeks   Status Achieved   PT LONG TERM GOAL #4   Title demo good core (abdominal) control with therex in clinic   Time 4   Period Weeks   Status On-going               Plan - 08/10/14 1025    Clinical Impression Statement Patient continues to progress with LE and core strngthening activities. Patient has good technique and tolerace with exercises. Progressed with elliptical and will continue per patient tolerace. Goals ongoing due to strength limitations.   Pt will benefit from skilled therapeutic intervention in order to improve on the following deficits  Decreased range of motion;Decreased activity tolerance;Pain;Impaired flexibility;Decreased strength   Rehab Potential Excellent   PT Frequency 2x / week   PT Duration 4 weeks   PT Treatment/Interventions ADLs/Self Care Home Management;Cryotherapy;Electrical Stimulation;Moist Heat;Therapeutic exercise;Neuromuscular re-education;Patient/family education;Manual techniques;Passive range of motion   PT Next Visit Plan Continue POC with core and BLE strengthening.        Problem List Patient Active Problem List   Diagnosis Date Noted  . Abnormal weight loss 06/09/2011  . Chronic diarrhea 06/09/2011  . Diarrhea 03/19/2011  . Rectal bleeding 03/19/2011  . GERD (gastroesophageal reflux disease) 03/19/2011    Ernisha Sorn P, PTA 08/10/2014, 10:27 AM  [facility] [address] [place], [place], [address] Phone: [phone]   Fax:  [phone]

## 2014-08-15 ENCOUNTER — Encounter: Payer: Self-pay | Admitting: Physical Therapy

## 2014-08-15 ENCOUNTER — Ambulatory Visit: Payer: 59 | Admitting: Physical Therapy

## 2014-08-15 DIAGNOSIS — R531 Weakness: Secondary | ICD-10-CM | POA: Diagnosis not present

## 2014-08-15 NOTE — Therapy (Signed)
[facility] [place], [place], [address] Phone: [phone]   Fax:  [phone]  Physical Therapy Treatment  Patient Details  Name: Monica Elliott MRN: [id number] Date of Birth: [birth year] Referring Provider:  Terald Sleeper, PA-C  Encounter Date: 08/15/2014      PT End of Session - 08/15/14 1107    Visit Number 5   Number of Visits 8   Date for PT Re-Evaluation 08/28/14   PT Start Time 1028   PT Stop Time 1109   PT Time Calculation (min) 41 min   Activity Tolerance Patient tolerated treatment well   Behavior During Therapy [facility] for tasks assessed/performed      Past Medical History  Diagnosis Date  . Hypertension   . Hypercholesterolemia   . Anemia     states low iron    Past Surgical History  Procedure Laterality Date  . Tubal ligation    . Colonoscopy  04/06/2011    Colonic diverticulosis and colonic polyps-removed as described above. Polyps benign. Next colonoscopy 04/2021.    There were no vitals filed for this visit.  Visit Diagnosis:  Generalized weakness      Subjective Assessment - 08/15/14 1038    Subjective States that her RLE is the worst of her LEs, elliptical may have been too much last treatment   Patient Stated Goals to get stronger   Currently in Pain? No/denies                         [facility] Adult PT Treatment/Exercise - 08/15/14 0001    Lumbar Exercises: Standing   Other Standing Lumbar Exercises scap retractions/ext with pink XTS draw in focus   Knee/Hip Exercises: Aerobic   Nustep L5 x 86min with posture focus   Knee/Hip Exercises: Machines for Strengthening   Cybex Knee Extension 10# 3x10   Cybex Knee Flexion 30# 3x10   Knee/Hip Exercises: Standing   Lateral Step Up Both;2 sets;10 reps   Forward Step Up Both;2 sets;10 reps   Wall Squat 2 sets;10 reps   Knee/Hip Exercises: Sidelying   Hip ABduction Strengthening;Both;2 sets;10 reps   Clams 4# 2x10 each LE                      PT Long Term Goals - 08/08/14 1022    PT LONG TERM GOAL #1   Title I with HEP   Time 4   Period Weeks   Status Achieved   PT LONG TERM GOAL #2   Title improved BLE strength to 4+/5 or better   Time 4   Period Weeks   Status On-going   PT LONG TERM GOAL #3   Title demo and verbalize understanding of correct body mechanics to prevent further injury to back   Time 4   Period Weeks   Status Achieved   PT LONG TERM GOAL #4   Title demo good core (abdominal) control with therex in clinic   Time 4   Period Weeks   Status On-going               Plan - 08/15/14 1108    Clinical Impression Statement Patient continues to progress with bil LE strengthening activities. Patient has good technique with all exercises. Unalble to perform elliptical today, may have been too much. goals ongoing due to strength limitations.    Pt will benefit from skilled therapeutic intervention in order to improve on the  following deficits Decreased range of motion;Decreased activity tolerance;Pain;Impaired flexibility;Decreased strength   Rehab Potential Excellent   PT Frequency 2x / week   PT Duration 4 weeks   PT Treatment/Interventions ADLs/Self Care Home Management;Cryotherapy;Electrical Stimulation;Moist Heat;Therapeutic exercise;Neuromuscular re-education;Patient/family education;Manual techniques;Passive range of motion   PT Next Visit Plan Continue POC with core and BLE strengthening.   Consulted and Agree with Plan of Care Patient        Problem List Patient Active Problem List   Diagnosis Date Noted  . Abnormal weight loss 06/09/2011  . Chronic diarrhea 06/09/2011  . Diarrhea 03/19/2011  . Rectal bleeding 03/19/2011  . GERD (gastroesophageal reflux disease) 03/19/2011    Tali Coster P, PTA 08/15/2014, 11:11 AM  [facility] [address] [place], [place], [address] Phone: [phone]   Fax:   [phone]

## 2015-02-06 DIAGNOSIS — F329 Major depressive disorder, single episode, unspecified: Secondary | ICD-10-CM | POA: Insufficient documentation

## 2015-02-06 DIAGNOSIS — F419 Anxiety disorder, unspecified: Secondary | ICD-10-CM | POA: Insufficient documentation

## 2015-02-06 DIAGNOSIS — F325 Major depressive disorder, single episode, in full remission: Secondary | ICD-10-CM | POA: Insufficient documentation

## 2015-03-09 NOTE — Therapy (Signed)
[facility] [place], [place], [address] Phone: [phone]   Fax:  [phone]  Physical Therapy Treatment  Patient Details  Name: Monica Elliott MRN: [id number] Date of Birth: [birth year] No Data Recorded  Encounter Date: 08/15/2014    Past Medical History  Diagnosis Date  . Hypertension   . Hypercholesterolemia   . Anemia     states low iron    Past Surgical History  Procedure Laterality Date  . Tubal ligation    . Colonoscopy  04/06/2011    Colonic diverticulosis and colonic polyps-removed as described above. Polyps benign. Next colonoscopy 04/2021.    There were no vitals filed for this visit.  Visit Diagnosis:  Generalized weakness                                    PT Long Term Goals - 08/08/14 1022    PT LONG TERM GOAL #1   Title I with HEP   Time 4   Period Weeks   Status Achieved   PT LONG TERM GOAL #2   Title improved BLE strength to 4+/5 or better   Time 4   Period Weeks   Status On-going   PT LONG TERM GOAL #3   Title demo and verbalize understanding of correct body mechanics to prevent further injury to back   Time 4   Period Weeks   Status Achieved   PT LONG TERM GOAL #4   Title demo good core (abdominal) control with therex in clinic   Time 4   Period Weeks   Status On-going               Problem List Patient Active Problem List   Diagnosis Date Noted  . Abnormal weight loss 06/09/2011  . Chronic diarrhea 06/09/2011  . Diarrhea 03/19/2011  . Rectal bleeding 03/19/2011  . GERD (gastroesophageal reflux disease) 03/19/2011   PHYSICAL THERAPY DISCHARGE SUMMARY  Visits from Start of Care:  Current functional level related to goals / functional outcomes: Please see above.    Remaining deficits: Cont weakness.   Education / Equipment: HEP.  Plan: Patient agrees to discharge.  Patient goals were partially met. Patient is being discharged  due to not returning since the last visit.  ?????      Meri Pelot, [place] MPT 03/09/2015, 5:07 PM  [facility] [address] [place], [place], [address] Phone: [phone]   Fax:  [phone]  Name: Monica Elliott MRN: [id number] Date of Birth: [birth year]

## 2015-05-06 DIAGNOSIS — F321 Major depressive disorder, single episode, moderate: Secondary | ICD-10-CM | POA: Diagnosis not present

## 2015-05-13 DIAGNOSIS — F321 Major depressive disorder, single episode, moderate: Secondary | ICD-10-CM | POA: Diagnosis not present

## 2015-05-20 DIAGNOSIS — F321 Major depressive disorder, single episode, moderate: Secondary | ICD-10-CM | POA: Diagnosis not present

## 2015-06-03 DIAGNOSIS — F419 Anxiety disorder, unspecified: Secondary | ICD-10-CM | POA: Diagnosis not present

## 2015-06-03 DIAGNOSIS — F17200 Nicotine dependence, unspecified, uncomplicated: Secondary | ICD-10-CM | POA: Diagnosis not present

## 2015-06-03 DIAGNOSIS — F411 Generalized anxiety disorder: Secondary | ICD-10-CM | POA: Diagnosis not present

## 2015-06-03 DIAGNOSIS — E785 Hyperlipidemia, unspecified: Secondary | ICD-10-CM | POA: Diagnosis not present

## 2015-06-03 DIAGNOSIS — F4321 Adjustment disorder with depressed mood: Secondary | ICD-10-CM | POA: Diagnosis not present

## 2015-06-03 DIAGNOSIS — J449 Chronic obstructive pulmonary disease, unspecified: Secondary | ICD-10-CM | POA: Diagnosis not present

## 2015-06-03 DIAGNOSIS — R634 Abnormal weight loss: Secondary | ICD-10-CM | POA: Diagnosis not present

## 2015-06-06 DIAGNOSIS — E785 Hyperlipidemia, unspecified: Secondary | ICD-10-CM | POA: Diagnosis not present

## 2015-06-06 DIAGNOSIS — R634 Abnormal weight loss: Secondary | ICD-10-CM | POA: Diagnosis not present

## 2015-06-06 DIAGNOSIS — R531 Weakness: Secondary | ICD-10-CM | POA: Diagnosis not present

## 2015-06-06 DIAGNOSIS — F419 Anxiety disorder, unspecified: Secondary | ICD-10-CM | POA: Diagnosis not present

## 2015-06-12 ENCOUNTER — Other Ambulatory Visit: Payer: Self-pay | Admitting: Physician Assistant

## 2015-06-12 DIAGNOSIS — R921 Mammographic calcification found on diagnostic imaging of breast: Secondary | ICD-10-CM

## 2015-06-12 DIAGNOSIS — N6489 Other specified disorders of breast: Secondary | ICD-10-CM | POA: Diagnosis not present

## 2015-06-12 DIAGNOSIS — N63 Unspecified lump in breast: Secondary | ICD-10-CM | POA: Diagnosis not present

## 2015-06-12 DIAGNOSIS — N6019 Diffuse cystic mastopathy of unspecified breast: Secondary | ICD-10-CM | POA: Diagnosis not present

## 2015-06-17 ENCOUNTER — Other Ambulatory Visit: Payer: Self-pay | Admitting: Physician Assistant

## 2015-06-17 ENCOUNTER — Ambulatory Visit
Admission: RE | Admit: 2015-06-17 | Discharge: 2015-06-17 | Disposition: A | Discharge status: Home / Self Care | Payer: BLUE CROSS/BLUE SHIELD | Source: Ambulatory Visit | Attending: Physician Assistant | Admitting: Physician Assistant

## 2015-06-17 DIAGNOSIS — R921 Mammographic calcification found on diagnostic imaging of breast: Secondary | ICD-10-CM

## 2015-06-17 DIAGNOSIS — N6012 Diffuse cystic mastopathy of left breast: Secondary | ICD-10-CM | POA: Diagnosis not present

## 2015-06-24 DIAGNOSIS — F4321 Adjustment disorder with depressed mood: Secondary | ICD-10-CM | POA: Diagnosis not present

## 2015-06-24 DIAGNOSIS — F411 Generalized anxiety disorder: Secondary | ICD-10-CM | POA: Diagnosis not present

## 2015-07-11 DIAGNOSIS — I10 Essential (primary) hypertension: Secondary | ICD-10-CM | POA: Diagnosis not present

## 2015-07-11 DIAGNOSIS — R921 Mammographic calcification found on diagnostic imaging of breast: Secondary | ICD-10-CM | POA: Diagnosis not present

## 2015-07-11 DIAGNOSIS — F329 Major depressive disorder, single episode, unspecified: Secondary | ICD-10-CM | POA: Diagnosis not present

## 2015-07-15 DIAGNOSIS — F4321 Adjustment disorder with depressed mood: Secondary | ICD-10-CM | POA: Diagnosis not present

## 2015-07-15 DIAGNOSIS — F411 Generalized anxiety disorder: Secondary | ICD-10-CM | POA: Diagnosis not present

## 2015-08-09 DIAGNOSIS — F4321 Adjustment disorder with depressed mood: Secondary | ICD-10-CM | POA: Diagnosis not present

## 2015-08-09 DIAGNOSIS — F411 Generalized anxiety disorder: Secondary | ICD-10-CM | POA: Diagnosis not present

## 2015-08-12 DIAGNOSIS — Z01419 Encounter for gynecological examination (general) (routine) without abnormal findings: Secondary | ICD-10-CM | POA: Diagnosis not present

## 2015-08-12 DIAGNOSIS — Z681 Body mass index (BMI) 19 or less, adult: Secondary | ICD-10-CM | POA: Diagnosis not present

## 2015-08-12 DIAGNOSIS — Z0000 Encounter for general adult medical examination without abnormal findings: Secondary | ICD-10-CM | POA: Diagnosis not present

## 2015-08-12 LAB — HM PAP SMEAR: HM PAP: NORMAL

## 2015-09-03 DIAGNOSIS — F411 Generalized anxiety disorder: Secondary | ICD-10-CM | POA: Diagnosis not present

## 2015-09-03 DIAGNOSIS — F4321 Adjustment disorder with depressed mood: Secondary | ICD-10-CM | POA: Diagnosis not present

## 2015-09-24 DIAGNOSIS — F411 Generalized anxiety disorder: Secondary | ICD-10-CM | POA: Diagnosis not present

## 2015-09-24 DIAGNOSIS — F4321 Adjustment disorder with depressed mood: Secondary | ICD-10-CM | POA: Diagnosis not present

## 2015-11-11 DIAGNOSIS — F411 Generalized anxiety disorder: Secondary | ICD-10-CM | POA: Diagnosis not present

## 2015-11-11 DIAGNOSIS — F4321 Adjustment disorder with depressed mood: Secondary | ICD-10-CM | POA: Diagnosis not present

## 2015-11-29 ENCOUNTER — Ambulatory Visit (INDEPENDENT_AMBULATORY_CARE_PROVIDER_SITE_OTHER): Payer: BLUE CROSS/BLUE SHIELD | Admitting: Psychology

## 2015-11-29 DIAGNOSIS — F321 Major depressive disorder, single episode, moderate: Secondary | ICD-10-CM | POA: Diagnosis not present

## 2015-12-19 ENCOUNTER — Ambulatory Visit (INDEPENDENT_AMBULATORY_CARE_PROVIDER_SITE_OTHER): Payer: BLUE CROSS/BLUE SHIELD | Admitting: Psychology

## 2015-12-19 DIAGNOSIS — F321 Major depressive disorder, single episode, moderate: Secondary | ICD-10-CM

## 2016-01-03 ENCOUNTER — Ambulatory Visit (INDEPENDENT_AMBULATORY_CARE_PROVIDER_SITE_OTHER): Payer: BLUE CROSS/BLUE SHIELD | Admitting: Psychology

## 2016-01-03 DIAGNOSIS — F321 Major depressive disorder, single episode, moderate: Secondary | ICD-10-CM

## 2016-01-15 ENCOUNTER — Ambulatory Visit (INDEPENDENT_AMBULATORY_CARE_PROVIDER_SITE_OTHER): Payer: BLUE CROSS/BLUE SHIELD | Admitting: Psychology

## 2016-01-15 DIAGNOSIS — F419 Anxiety disorder, unspecified: Secondary | ICD-10-CM | POA: Diagnosis not present

## 2016-01-15 DIAGNOSIS — F321 Major depressive disorder, single episode, moderate: Secondary | ICD-10-CM

## 2016-01-15 DIAGNOSIS — F322 Major depressive disorder, single episode, severe without psychotic features: Secondary | ICD-10-CM | POA: Diagnosis not present

## 2016-01-22 ENCOUNTER — Ambulatory Visit: Payer: Self-pay | Admitting: Psychology

## 2016-01-29 ENCOUNTER — Ambulatory Visit: Payer: Self-pay | Admitting: Psychology

## 2016-02-05 ENCOUNTER — Ambulatory Visit (INDEPENDENT_AMBULATORY_CARE_PROVIDER_SITE_OTHER): Payer: BLUE CROSS/BLUE SHIELD | Admitting: Psychology

## 2016-02-05 DIAGNOSIS — F321 Major depressive disorder, single episode, moderate: Secondary | ICD-10-CM

## 2016-02-12 ENCOUNTER — Ambulatory Visit (INDEPENDENT_AMBULATORY_CARE_PROVIDER_SITE_OTHER): Payer: BLUE CROSS/BLUE SHIELD | Admitting: Psychology

## 2016-02-12 DIAGNOSIS — F321 Major depressive disorder, single episode, moderate: Secondary | ICD-10-CM

## 2016-02-19 ENCOUNTER — Ambulatory Visit: Payer: BLUE CROSS/BLUE SHIELD | Admitting: Psychology

## 2016-03-04 DIAGNOSIS — F419 Anxiety disorder, unspecified: Secondary | ICD-10-CM | POA: Diagnosis not present

## 2016-03-10 ENCOUNTER — Ambulatory Visit (INDEPENDENT_AMBULATORY_CARE_PROVIDER_SITE_OTHER): Payer: BLUE CROSS/BLUE SHIELD | Admitting: Psychology

## 2016-03-10 DIAGNOSIS — F321 Major depressive disorder, single episode, moderate: Secondary | ICD-10-CM | POA: Diagnosis not present

## 2016-03-25 ENCOUNTER — Ambulatory Visit (INDEPENDENT_AMBULATORY_CARE_PROVIDER_SITE_OTHER): Payer: BLUE CROSS/BLUE SHIELD | Admitting: Psychology

## 2016-03-25 DIAGNOSIS — F321 Major depressive disorder, single episode, moderate: Secondary | ICD-10-CM | POA: Diagnosis not present

## 2016-03-31 ENCOUNTER — Other Ambulatory Visit: Payer: Self-pay | Admitting: *Deleted

## 2016-03-31 MED ORDER — SIMVASTATIN 40 MG PO TABS: 40.0000 mg | ORAL_TABLET | Freq: Every evening | ORAL | 2 refills | Status: DC

## 2016-04-08 ENCOUNTER — Encounter: Payer: Self-pay | Admitting: Primary Care

## 2016-04-08 ENCOUNTER — Ambulatory Visit (INDEPENDENT_AMBULATORY_CARE_PROVIDER_SITE_OTHER): Payer: BLUE CROSS/BLUE SHIELD | Admitting: Primary Care

## 2016-04-08 ENCOUNTER — Ambulatory Visit (INDEPENDENT_AMBULATORY_CARE_PROVIDER_SITE_OTHER): Payer: BLUE CROSS/BLUE SHIELD | Admitting: Psychology

## 2016-04-08 VITALS — BP 122/78 | HR 72 | Temp 97.9°F | Ht 67.5 in | Wt 113.4 lb

## 2016-04-08 DIAGNOSIS — F411 Generalized anxiety disorder: Secondary | ICD-10-CM

## 2016-04-08 DIAGNOSIS — D509 Iron deficiency anemia, unspecified: Secondary | ICD-10-CM | POA: Diagnosis not present

## 2016-04-08 DIAGNOSIS — E559 Vitamin D deficiency, unspecified: Secondary | ICD-10-CM | POA: Diagnosis not present

## 2016-04-08 DIAGNOSIS — I10 Essential (primary) hypertension: Secondary | ICD-10-CM | POA: Diagnosis not present

## 2016-04-08 DIAGNOSIS — F332 Major depressive disorder, recurrent severe without psychotic features: Secondary | ICD-10-CM | POA: Diagnosis not present

## 2016-04-08 DIAGNOSIS — E785 Hyperlipidemia, unspecified: Secondary | ICD-10-CM | POA: Diagnosis not present

## 2016-04-08 DIAGNOSIS — E782 Mixed hyperlipidemia: Secondary | ICD-10-CM | POA: Insufficient documentation

## 2016-04-08 LAB — CBC
HEMATOCRIT: 34.9 % — AB (ref 36.0–46.0)
Hemoglobin: 11.8 g/dL — ABNORMAL LOW (ref 12.0–15.0)
MCHC: 33.9 g/dL (ref 30.0–36.0)
MCV: 82.9 fl (ref 78.0–100.0)
PLATELETS: 189 10*3/uL (ref 150.0–400.0)
RBC: 4.22 Mil/uL (ref 3.87–5.11)
RDW: 15.4 % (ref 11.5–15.5)
WBC: 5.9 10*3/uL (ref 4.0–10.5)

## 2016-04-08 LAB — COMPREHENSIVE METABOLIC PANEL
ALBUMIN: 4.3 g/dL (ref 3.5–5.2)
ALT: 21 U/L (ref 0–35)
AST: 17 U/L (ref 0–37)
Alkaline Phosphatase: 60 U/L (ref 39–117)
BUN: 10 mg/dL (ref 6–23)
CALCIUM: 9.6 mg/dL (ref 8.4–10.5)
CHLORIDE: 107 meq/L (ref 96–112)
CO2: 30 meq/L (ref 19–32)
CREATININE: 0.5 mg/dL (ref 0.40–1.20)
GFR: 163.11 mL/min (ref >60.00)
Glucose, Bld: 83 mg/dL (ref 70–99)
Potassium: 3.7 mEq/L (ref 3.5–5.1)
Sodium: 142 mEq/L (ref 135–145)
Total Bilirubin: 0.3 mg/dL (ref 0.2–1.2)
Total Protein: 6.9 g/dL (ref 6.0–8.3)

## 2016-04-08 LAB — LIPID PANEL
CHOLESTEROL: 247 mg/dL — AB (ref 0–200)
HDL: 64.1 mg/dL (ref >39.00)
LDL CALC: 168 mg/dL — AB (ref 0–99)
NonHDL: 182.59
Total CHOL/HDL Ratio: 4
Triglycerides: 71 mg/dL (ref 0.0–149.0)
VLDL: 14.2 mg/dL (ref 0.0–40.0)

## 2016-04-08 LAB — VITAMIN D 25 HYDROXY (VIT D DEFICIENCY, FRACTURES): VITD: 32.41 ng/mL (ref 30.00–100.00)

## 2016-04-08 NOTE — Assessment & Plan Note (Signed)
Following with therapy and psychiatry. Overall stable on current regimen. Denies SI/HI.

## 2016-04-08 NOTE — Progress Notes (Signed)
Subjective:    Patient ID: Monica Elliott, female    DOB: Jul 01, 1960, 56 y.o.   MRN: [id number]  HPI  Monica Elliott is a 56 year old female who presents today to establish care and discuss the problems mentioned below. Will obtain old records. Her last physical was 1 year ago.   1) Essential Hypertension: Diagnosed several year ago. Currently managed on lisinopril 40 mg.  She denies chest pain, dizziness, shortness of breath.   2) Hyperlipidemia: Diagnosed years ago. Currently managed on simvastatin 40 mg. Her last lipid panel was summer of 2017, normal. Denies myalgias.  3) GAD: Diagnosed one year ago. Currently managed on Cymbalta 30 mg and Buspar 15 mg BID. She's been on both of these medications for the past 1 month. She was previously managed on Celexa, Xanax, and Trazodone without improvement. She is currently following with a therapist 2-4 times monthly on average. She is currently following with a psychiatrist and has an appointment scheduled for tomorrow. She denies SI/HI.  Review of Systems  Eyes: Negative for visual disturbance.  Respiratory: Negative for shortness of breath.   Cardiovascular: Negative for chest pain.  Neurological: Negative for dizziness and headaches.  Psychiatric/Behavioral: Negative for suicidal ideas. The patient is not nervous/anxious.        Past Medical History:  Diagnosis Date  . Anemia    states low iron  . Depression   . Hypercholesterolemia   . Hypertension   . Vitamin D deficiency      Social History   Social History  . Marital status: Single    Spouse name: N/A  . Number of children: N/A  . Years of education: N/A   Occupational History  . Not on file.   Social History Main Topics  . Smoking status: Current Every Day Smoker    Packs/day: 0.75    Types: Cigarettes  . Smokeless tobacco: Never Used  . Alcohol use Yes     Comment: a glass of wine every other night  . Drug use: No  . Sexual activity: Not on file   Other Topics  Concern  . Not on file   Social History Narrative  . No narrative on file    Past Surgical History:  Procedure Laterality Date  . BACK SURGERY  2016  . COLONOSCOPY  04/06/2011   Colonic diverticulosis and colonic polyps-removed as described above. Polyps benign. Next colonoscopy 04/2021.  . TUBAL LIGATION      Family History  Problem Relation Age of Onset  . Heart disease Mother   . Hypertension Mother   . Diabetes Mother   . Hypertension Father   . Alcohol abuse Brother   . Colon cancer Neg Hx     No Known Allergies  Current Outpatient Prescriptions on File Prior to Visit  Medication Sig Dispense Refill  . Cholecalciferol (VITAMIN D PO) Take 5,000 Units by mouth daily.     Marland Kitchen lisinopril (PRINIVIL,ZESTRIL) 40 MG tablet Take 40 mg by mouth daily.    . simvastatin (ZOCOR) 40 MG tablet Take 1 tablet (40 mg total) by mouth every evening. 30 tablet 2   No current facility-administered medications on file prior to visit.     BP 122/78   Pulse 72   Temp 97.9 F (36.6 C) (Oral)   Ht 5' 7.5" (1.715 m)   Wt 113 lb 6.4 oz (51.4 kg)   LMP 05/10/2011   SpO2 99%   BMI 17.50 kg/m    Objective:  Physical Exam  Constitutional: She appears well-nourished.  Neck: Neck supple.  Cardiovascular: Normal rate and regular rhythm.   Pulmonary/Chest: Effort normal and breath sounds normal.  Skin: Skin is warm and dry.  Psychiatric: She has a normal mood and affect.          Assessment & Plan:

## 2016-04-08 NOTE — Assessment & Plan Note (Signed)
Stable in the office today, continue lisinopril 40 mg. 

## 2016-04-08 NOTE — Progress Notes (Signed)
Pre visit review using our clinic review tool, if applicable. No additional management support is needed unless otherwise documented below in the visit note. 

## 2016-04-08 NOTE — Assessment & Plan Note (Signed)
Managed on 5000 units daily. Check vitamin D today.

## 2016-04-08 NOTE — Assessment & Plan Note (Signed)
Managed on oral iron. Check CBC today. Denies fatigue.

## 2016-04-08 NOTE — Patient Instructions (Addendum)
Message or call me with the refills you are needing on your medications.  Continue to follow with Bambi and your psychiatrist.  .Complete lab work prior to leaving today.  Please schedule a physical with me up front at your convenience.  It was a pleasure to meet you today! Please don't hesitate to call me with any questions. Welcome to [facility]!

## 2016-04-08 NOTE — Assessment & Plan Note (Signed)
Managed on Zocor. Check cholesterol today. Denies myalgias.

## 2016-04-09 DIAGNOSIS — F419 Anxiety disorder, unspecified: Secondary | ICD-10-CM | POA: Diagnosis not present

## 2016-04-22 ENCOUNTER — Ambulatory Visit: Payer: BLUE CROSS/BLUE SHIELD | Admitting: Psychology

## 2016-04-27 ENCOUNTER — Ambulatory Visit (INDEPENDENT_AMBULATORY_CARE_PROVIDER_SITE_OTHER): Payer: BLUE CROSS/BLUE SHIELD | Admitting: Primary Care

## 2016-04-27 ENCOUNTER — Encounter: Payer: Self-pay | Admitting: Primary Care

## 2016-04-27 VITALS — BP 118/80 | HR 77 | Temp 98.4°F | Ht 68.0 in | Wt 115.4 lb

## 2016-04-27 DIAGNOSIS — Z0000 Encounter for general adult medical examination without abnormal findings: Secondary | ICD-10-CM

## 2016-04-27 DIAGNOSIS — I10 Essential (primary) hypertension: Secondary | ICD-10-CM | POA: Diagnosis not present

## 2016-04-27 DIAGNOSIS — D509 Iron deficiency anemia, unspecified: Secondary | ICD-10-CM | POA: Diagnosis not present

## 2016-04-27 DIAGNOSIS — E785 Hyperlipidemia, unspecified: Secondary | ICD-10-CM | POA: Diagnosis not present

## 2016-04-27 DIAGNOSIS — E559 Vitamin D deficiency, unspecified: Secondary | ICD-10-CM | POA: Diagnosis not present

## 2016-04-27 MED ORDER — ATORVASTATIN CALCIUM 40 MG PO TABS: 40.0000 mg | ORAL_TABLET | Freq: Every evening | ORAL | 1 refills | Status: DC

## 2016-04-27 MED ORDER — LISINOPRIL 40 MG PO TABS: 40.0000 mg | ORAL_TABLET | Freq: Every day | ORAL | 3 refills | Status: DC

## 2016-04-27 NOTE — Assessment & Plan Note (Signed)
Stable, continue 5000 units daily

## 2016-04-27 NOTE — Assessment & Plan Note (Signed)
Stable, continue oral iron  

## 2016-04-27 NOTE — Assessment & Plan Note (Signed)
Uncontrolled on simvastatin, switch to atorvastatin 40 mg. LFT's unremarkable. Repeat lipids in three months.

## 2016-04-27 NOTE — Progress Notes (Signed)
Subjective:    Patient ID: Monica Elliott, female    DOB: 25-Jul-1960, 56 y.o.   MRN: [id number]  HPI  Monica Elliott is a 56 year old female who presents today for complete physical.  Immunizations: -Tetanus: Completed with in 10 years. -Influenza: Did not complete last season.   Diet: She endorses a fair diet. Breakfast: Ham, eggs, toast Lunch: Sandwich Dinner: Take out, fast food; sometimes home cooked meals (pasta, burgers) Snacks: Occasionally, chips Desserts: None Beverages: Water, diet soda  Exercise: She does not currently exercise Eye exam: Due this year. Dental exam: Completes infrequently, dentures.  Colonoscopy: Completed in 2013, due in 2023 Pap Smear: Completed in August 2017, normal. Mammogram: Completed in May 2017.   Review of Systems  Constitutional: Negative for unexpected weight change.  HENT: Negative for rhinorrhea.   Respiratory: Negative for cough and shortness of breath.   Cardiovascular: Negative for chest pain.  Gastrointestinal: Negative for constipation and diarrhea.  Genitourinary: Negative for difficulty urinating and menstrual problem.  Musculoskeletal: Negative for arthralgias and myalgias.  Skin: Negative for rash.  Allergic/Immunologic: Negative for environmental allergies.  Neurological: Negative for dizziness, numbness and headaches.  Psychiatric/Behavioral:       Feels well managed overall for anxiety or depression.       Past Medical History:  Diagnosis Date  . Anemia    states low iron  . Depression   . Hypercholesterolemia   . Hypertension   . Vitamin D deficiency      Social History   Social History  . Marital status: Single    Spouse name: N/A  . Number of children: N/A  . Years of education: N/A   Occupational History  . Not on file.   Social History Main Topics  . Smoking status: Current Every Day Smoker    Packs/day: 0.75    Types: Cigarettes  . Smokeless tobacco: Never Used  . Alcohol use Yes   Comment: a glass of wine every other night  . Drug use: No  . Sexual activity: Not on file   Other Topics Concern  . Not on file   Social History Narrative  . No narrative on file    Past Surgical History:  Procedure Laterality Date  . BACK SURGERY  2016  . COLONOSCOPY  04/06/2011   Colonic diverticulosis and colonic polyps-removed as described above. Polyps benign. Next colonoscopy 04/2021.  . TUBAL LIGATION      Family History  Problem Relation Age of Onset  . Heart disease Mother   . Hypertension Mother   . Diabetes Mother   . Hypertension Father   . Alcohol abuse Brother   . Colon cancer Neg Hx     No Known Allergies  Current Outpatient Prescriptions on File Prior to Visit  Medication Sig Dispense Refill  . busPIRone (BUSPAR) 15 MG tablet Take 15 mg by mouth 2 (two) times daily.  0  . Cholecalciferol (VITAMIN D PO) Take 5,000 Units by mouth daily.     . DULoxetine (CYMBALTA) 30 MG capsule Take 60 capsules by mouth daily.  0  . ferrous sulfate 325 (65 FE) MG tablet Take 325 mg by mouth daily.  12  . simvastatin (ZOCOR) 40 MG tablet Take 1 tablet (40 mg total) by mouth every evening. 30 tablet 2   No current facility-administered medications on file prior to visit.     BP 118/80   Pulse 77   Temp 98.4 F (36.9 C) (Oral)   Ht 5'  8" (1.727 m)   Wt 115 lb 6.4 oz (52.3 kg)   LMP 05/10/2011   SpO2 99%   BMI 17.55 kg/m    Objective:   Physical Exam  Constitutional: She is oriented to person, place, and time. She appears well-nourished.  HENT:  Right Ear: Tympanic membrane and ear canal normal.  Left Ear: Tympanic membrane and ear canal normal.  Nose: Nose normal.  Mouth/Throat: Oropharynx is clear and moist.  Eyes: Conjunctivae and EOM are normal. Pupils are equal, round, and reactive to light.  Neck: Neck supple. No thyromegaly present.  Cardiovascular: Normal rate and regular rhythm.   No murmur heard. Pulmonary/Chest: Effort normal and breath sounds  normal. She has no rales.  Abdominal: Soft. Bowel sounds are normal. There is no tenderness.  Musculoskeletal: Normal range of motion.  Lymphadenopathy:    She has no cervical adenopathy.  Neurological: She is alert and oriented to person, place, and time. She has normal reflexes. No cranial nerve deficit.  Skin: Skin is warm and dry. No rash noted.  Psychiatric: She has a normal mood and affect.          Assessment & Plan:

## 2016-04-27 NOTE — Assessment & Plan Note (Signed)
Td UTD per patient. Pap and mammogram UTD. Colonoscopy UTD. Discussed to increase vegetables, fruit, whole grains. Exam unremarkable. Labs with hyperlipidemia, otherwise stable. Follow up in 1 year for annual exam.

## 2016-04-27 NOTE — Patient Instructions (Signed)
Your cholesterol is too high on the Simvastatin, we are changing you to atorvastatin. Stop Simvastatin 40 mg, start Atorvastatin 40 mg. Take 1 tablet by mouth every evening at bedtime.  I sent refills of lisinopril 40 mg to your pharmacy.  Ensure you are eating enough vegetables, fruit, whole grains.  Ensure you are consuming 64 ounces of water daily.  Schedule a lab only appointment in 3 months to recheck your cholesterol. Come fasting to this appointment. You may have water and black coffee.  Follow up in 1 year for your annual physical or sooner if needed.  It was a pleasure to see you today!

## 2016-04-27 NOTE — Assessment & Plan Note (Signed)
Stable today, continue ACE. BMP unremarkable.

## 2016-04-27 NOTE — Progress Notes (Signed)
Pre visit review using our clinic review tool, if applicable. No additional management support is needed unless otherwise documented below in the visit note. 

## 2016-04-28 ENCOUNTER — Encounter: Payer: Self-pay | Admitting: Primary Care

## 2016-05-06 ENCOUNTER — Ambulatory Visit (INDEPENDENT_AMBULATORY_CARE_PROVIDER_SITE_OTHER): Payer: BLUE CROSS/BLUE SHIELD | Admitting: Psychology

## 2016-05-06 DIAGNOSIS — F321 Major depressive disorder, single episode, moderate: Secondary | ICD-10-CM

## 2016-05-12 ENCOUNTER — Ambulatory Visit: Payer: BLUE CROSS/BLUE SHIELD | Admitting: Psychology

## 2016-05-19 ENCOUNTER — Ambulatory Visit: Payer: BLUE CROSS/BLUE SHIELD | Admitting: Psychology

## 2016-05-26 ENCOUNTER — Ambulatory Visit (INDEPENDENT_AMBULATORY_CARE_PROVIDER_SITE_OTHER): Payer: BLUE CROSS/BLUE SHIELD | Admitting: Psychology

## 2016-05-26 DIAGNOSIS — F321 Major depressive disorder, single episode, moderate: Secondary | ICD-10-CM | POA: Diagnosis not present

## 2016-05-27 ENCOUNTER — Other Ambulatory Visit: Payer: Self-pay | Admitting: Physician Assistant

## 2016-06-04 ENCOUNTER — Ambulatory Visit: Payer: BLUE CROSS/BLUE SHIELD | Admitting: Psychology

## 2016-06-10 ENCOUNTER — Ambulatory Visit (INDEPENDENT_AMBULATORY_CARE_PROVIDER_SITE_OTHER): Payer: BLUE CROSS/BLUE SHIELD | Admitting: Psychology

## 2016-06-10 DIAGNOSIS — F321 Major depressive disorder, single episode, moderate: Secondary | ICD-10-CM

## 2016-06-16 DIAGNOSIS — F419 Anxiety disorder, unspecified: Secondary | ICD-10-CM | POA: Diagnosis not present

## 2016-06-30 ENCOUNTER — Ambulatory Visit: Payer: BLUE CROSS/BLUE SHIELD | Admitting: Psychology

## 2016-06-30 IMAGING — MG MM LT BREAST BX W LOC DEV 1ST LESION
2 series · 2 of 2 positions shown · non-contrast
Comparison: Previous exams.

ADDENDUM:
Pathology revealed FIBROCYSTIC CHANGES WITH CALCIFICATIONS of the
upper Left breast. This was found to be concordant by Dr. Kotlina Cudak.
Pathology results were discussed with the patient by telephone. The
patient reported doing well after the biopsy with oozing at the
site. Post biopsy instructions and care were reviewed and questions
were answered. The patient was encouraged to call The [REDACTED] for any additional concerns. The patient was
instructed to return for annual screening mammography at [facility] in [HOSPITAL][HOSPITAL].

Pathology results reported by Evane Lorent, RN on 06/18/2015.
CLINICAL DATA: 54-year-old female for tissue sampling of
indeterminate calcifications within the upper left breast.
EXAM:
LEFT BREAST STEREOTACTIC CORE NEEDLE BIOPSY

[L SPECIMEN (1 of 2)]
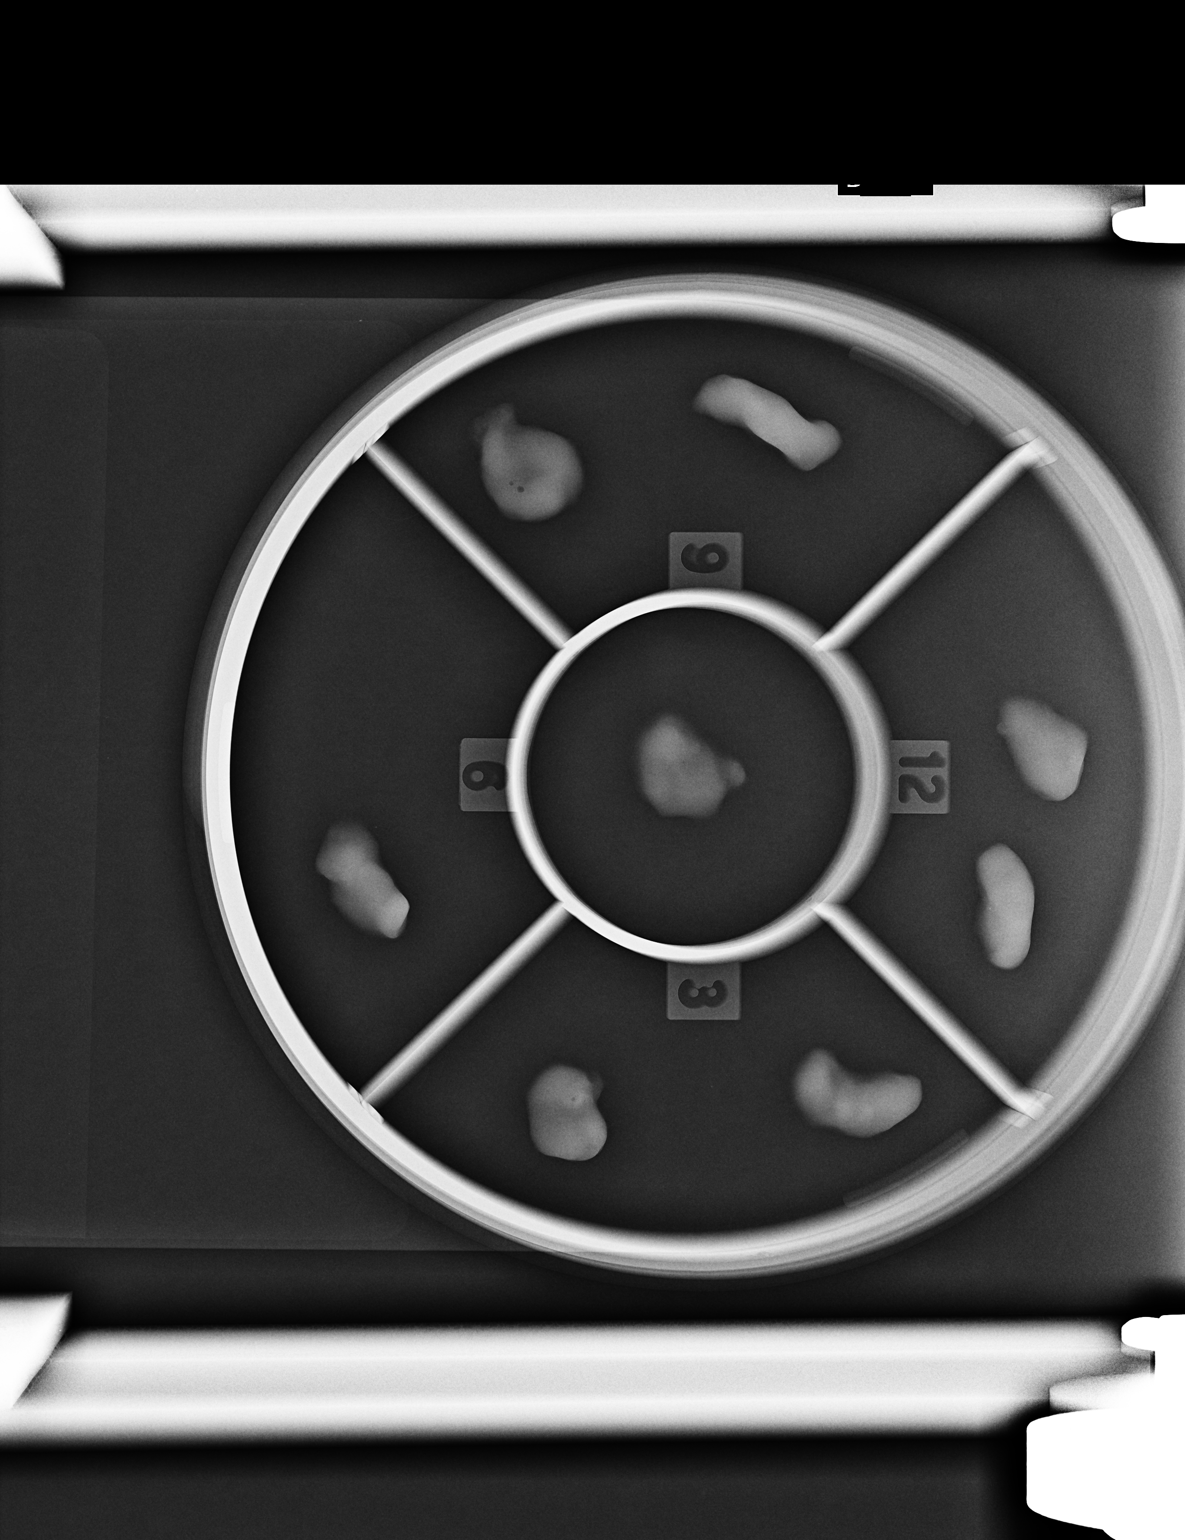

[L SPECIMEN (2 of 2)]
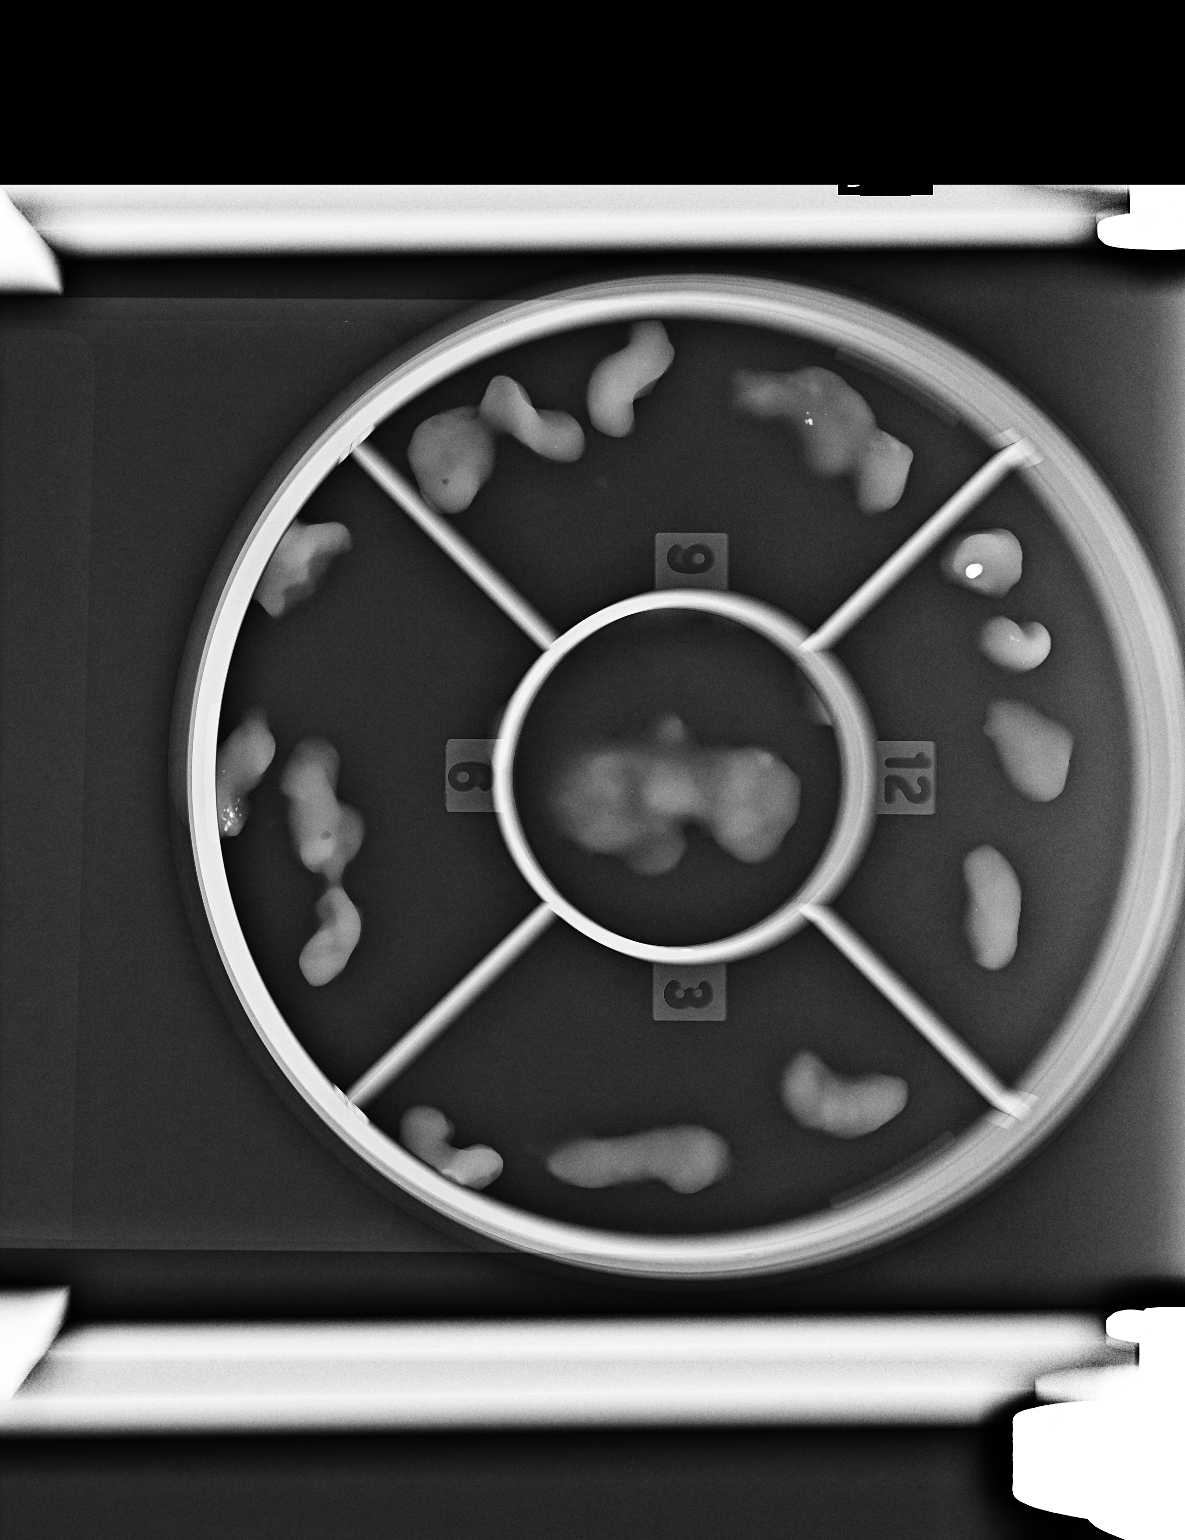

[2 of 2 positions shown; findings below may reference images not displayed]



Using sterile technique and 1% Lidocaine as local anesthetic, under
stereotactic guidance, a 9 gauge vacuum assisted needle device was
used to perform core needle biopsy of calcifications within the
upper left breast using a lateral approach. Specimen radiograph was
performed showing calcifications. Specimens with calcifications are
identified for pathology.

At the conclusion of the procedure, a coil shaped tissue marker clip
was deployed into the biopsy cavity. Follow-up 2-view mammogram was
performed and dictated separately.
IMPRESSION: Stereotactic-guided biopsy of upper left breast calcifications. No
apparent complications.

Pathology will be followed.

## 2016-07-08 ENCOUNTER — Ambulatory Visit: Payer: BLUE CROSS/BLUE SHIELD | Admitting: Psychology

## 2016-07-15 ENCOUNTER — Ambulatory Visit (INDEPENDENT_AMBULATORY_CARE_PROVIDER_SITE_OTHER): Payer: BLUE CROSS/BLUE SHIELD | Admitting: Psychology

## 2016-07-15 DIAGNOSIS — F321 Major depressive disorder, single episode, moderate: Secondary | ICD-10-CM

## 2016-09-10 DIAGNOSIS — F419 Anxiety disorder, unspecified: Secondary | ICD-10-CM | POA: Diagnosis not present

## 2016-09-16 ENCOUNTER — Ambulatory Visit (INDEPENDENT_AMBULATORY_CARE_PROVIDER_SITE_OTHER): Payer: Self-pay | Admitting: Psychology

## 2016-09-16 DIAGNOSIS — F322 Major depressive disorder, single episode, severe without psychotic features: Secondary | ICD-10-CM

## 2016-10-13 ENCOUNTER — Ambulatory Visit (INDEPENDENT_AMBULATORY_CARE_PROVIDER_SITE_OTHER): Payer: BLUE CROSS/BLUE SHIELD | Admitting: Psychology

## 2016-10-13 ENCOUNTER — Ambulatory Visit (INDEPENDENT_AMBULATORY_CARE_PROVIDER_SITE_OTHER): Payer: BLUE CROSS/BLUE SHIELD | Admitting: Primary Care

## 2016-10-13 ENCOUNTER — Encounter: Payer: Self-pay | Admitting: Primary Care

## 2016-10-13 VITALS — BP 122/76 | HR 71 | Temp 98.9°F | Ht 68.0 in | Wt 123.8 lb

## 2016-10-13 DIAGNOSIS — R634 Abnormal weight loss: Secondary | ICD-10-CM | POA: Diagnosis not present

## 2016-10-13 DIAGNOSIS — F411 Generalized anxiety disorder: Secondary | ICD-10-CM | POA: Diagnosis not present

## 2016-10-13 DIAGNOSIS — F321 Major depressive disorder, single episode, moderate: Secondary | ICD-10-CM

## 2016-10-13 DIAGNOSIS — I10 Essential (primary) hypertension: Secondary | ICD-10-CM

## 2016-10-13 DIAGNOSIS — E785 Hyperlipidemia, unspecified: Secondary | ICD-10-CM

## 2016-10-13 LAB — COMPREHENSIVE METABOLIC PANEL
ALT: 11 U/L (ref 0–35)
AST: 13 U/L (ref 0–37)
Albumin: 4.5 g/dL (ref 3.5–5.2)
Alkaline Phosphatase: 52 U/L (ref 39–117)
BUN: 9 mg/dL (ref 6–23)
CO2: 29 meq/L (ref 19–32)
CREATININE: 0.57 mg/dL (ref 0.40–1.20)
Calcium: 9.8 mg/dL (ref 8.4–10.5)
Chloride: 107 mEq/L (ref 96–112)
GFR: 139.97 mL/min (ref >60.00)
GLUCOSE: 90 mg/dL (ref 70–99)
POTASSIUM: 4.4 meq/L (ref 3.5–5.1)
Sodium: 143 mEq/L (ref 135–145)
TOTAL PROTEIN: 6.9 g/dL (ref 6.0–8.3)
Total Bilirubin: 0.3 mg/dL (ref 0.2–1.2)

## 2016-10-13 LAB — LIPID PANEL
CHOL/HDL RATIO: 4
Cholesterol: 278 mg/dL — ABNORMAL HIGH (ref 0–200)
HDL: 65.5 mg/dL (ref >39.00)
LDL CALC: 195 mg/dL — AB (ref 0–99)
NONHDL: 212.37
TRIGLYCERIDES: 87 mg/dL (ref 0.0–149.0)
VLDL: 17.4 mg/dL (ref 0.0–40.0)

## 2016-10-13 NOTE — Assessment & Plan Note (Signed)
Stable in the office today, continue lisinopril 40 mg.

## 2016-10-13 NOTE — Progress Notes (Signed)
Subjective:    Patient ID: Monica Elliott, female    DOB: 1960/11/22, 56 y.o.   MRN: [id number]  HPI  Monica Elliott is a 56 year old female with a history of anxiety disorder who presents today for follow up.  1) Essential Hypertension: Currently managed on lisinopril 40 mg. Her BP in the office today is 122/76. She denies chest pain, headaches. She is compliant to her medication.  2) Generalized Anxiety Disorder/Depression: Currently managed on buspirone 15 mg BID, Abilify 5 mg daily, mirtazapine 15 mg at bedtime. She is currently following with psychiatrist once monthly and therapist once weekly. Her brother is with her today and is wanting to ensure we have record of her current medications. She denies SI/HI. Overall she doesn't feel as though she's improving much, but is compliant to all office visits.  3) Hyperlipidemia: Currently managed on atorvastatin 40 mg. Previously managed on Simvastatin but was switched in late March 2018 due to uncontrolled levels. She's not since returned for recheck as recommended. She had a piece of toast this morning. She denies myalgias.  Review of Systems  Eyes: Negative for visual disturbance.  Respiratory: Negative for shortness of breath.   Cardiovascular: Negative for chest pain.  Neurological: Negative for dizziness and headaches.  Psychiatric/Behavioral:       Following with psychiatry and therapy       Past Medical History:  Diagnosis Date  . Anemia    states low iron  . Depression   . Hypercholesterolemia   . Hypertension   . Vitamin D deficiency      Social History   Social History  . Marital status: Single    Spouse name: N/A  . Number of children: N/A  . Years of education: N/A   Occupational History  . Not on file.   Social History Main Topics  . Smoking status: Current Every Day Smoker    Packs/day: 0.75    Types: Cigarettes  . Smokeless tobacco: Never Used  . Alcohol use Yes     Comment: a glass of wine every other  night  . Drug use: No  . Sexual activity: Not on file   Other Topics Concern  . Not on file   Social History Narrative  . No narrative on file    Past Surgical History:  Procedure Laterality Date  . BACK SURGERY  2016  . COLONOSCOPY  04/06/2011   Colonic diverticulosis and colonic polyps-removed as described above. Polyps benign. Next colonoscopy 04/2021.  . TUBAL LIGATION      Family History  Problem Relation Age of Onset  . Heart disease Mother   . Hypertension Mother   . Diabetes Mother   . Hypertension Father   . Alcohol abuse Brother   . Colon cancer Neg Hx     No Known Allergies  Current Outpatient Prescriptions on File Prior to Visit  Medication Sig Dispense Refill  . atorvastatin (LIPITOR) 40 MG tablet Take 1 tablet (40 mg total) by mouth every evening. 90 tablet 1  . busPIRone (BUSPAR) 15 MG tablet Take 15 mg by mouth 2 (two) times daily.  0  . ferrous sulfate 325 (65 FE) MG tablet Take 325 mg by mouth daily.  12  . lisinopril (PRINIVIL,ZESTRIL) 40 MG tablet Take 1 tablet (40 mg total) by mouth daily. 90 tablet 3   No current facility-administered medications on file prior to visit.     BP 122/76   Pulse 71   Temp 98.9 F (  37.2 C) (Oral)   Ht 5\' 8"  (1.727 m)   Wt 123 lb 12.8 oz (56.2 kg)   LMP 05/10/2011   SpO2 99%   BMI 18.82 kg/m    Objective:   Physical Exam  Constitutional: She appears well-nourished.  Neck: Neck supple.  Cardiovascular: Normal rate and regular rhythm.   Pulmonary/Chest: Effort normal and breath sounds normal.  Skin: Skin is warm and dry.  Psychiatric: She has a normal mood and affect.          Assessment & Plan:

## 2016-10-13 NOTE — Assessment & Plan Note (Signed)
Weight gain of 8 pounds since last visit, working with psychiatry and therapy. Continue mirtazapine.

## 2016-10-13 NOTE — Assessment & Plan Note (Signed)
All medications updated today. Continue with psychiatry and therapy sessions. Check CMP given changes in medications.

## 2016-10-13 NOTE — Assessment & Plan Note (Signed)
Did not return for repeat lipids after switching from simvastatin to atorvastatin. Recheck lipids today. Check LFT's.

## 2016-10-13 NOTE — Patient Instructions (Signed)
Complete lab work prior to leaving today. I will notify you of your results once received.   Please schedule a physical with me in 6 months. You may also schedule a lab only appointment 3-4 days prior. We will discuss your lab results in detail during your physical.  It was a pleasure to see you today!

## 2016-10-18 ENCOUNTER — Other Ambulatory Visit: Payer: Self-pay | Admitting: Primary Care

## 2016-10-18 DIAGNOSIS — I10 Essential (primary) hypertension: Secondary | ICD-10-CM

## 2016-10-21 ENCOUNTER — Ambulatory Visit (INDEPENDENT_AMBULATORY_CARE_PROVIDER_SITE_OTHER): Payer: BLUE CROSS/BLUE SHIELD | Admitting: Psychology

## 2016-10-21 DIAGNOSIS — F321 Major depressive disorder, single episode, moderate: Secondary | ICD-10-CM

## 2016-10-23 ENCOUNTER — Encounter: Payer: Self-pay | Admitting: *Deleted

## 2016-11-11 ENCOUNTER — Ambulatory Visit: Payer: Medicaid Other | Admitting: Psychology

## 2016-11-18 ENCOUNTER — Ambulatory Visit (INDEPENDENT_AMBULATORY_CARE_PROVIDER_SITE_OTHER): Payer: BLUE CROSS/BLUE SHIELD | Admitting: Psychology

## 2016-11-18 DIAGNOSIS — F321 Major depressive disorder, single episode, moderate: Secondary | ICD-10-CM

## 2016-11-20 DIAGNOSIS — F419 Anxiety disorder, unspecified: Secondary | ICD-10-CM | POA: Diagnosis not present

## 2016-12-02 ENCOUNTER — Ambulatory Visit: Payer: BLUE CROSS/BLUE SHIELD | Admitting: Psychology

## 2016-12-06 ENCOUNTER — Other Ambulatory Visit: Payer: Self-pay | Admitting: Primary Care

## 2016-12-06 DIAGNOSIS — I10 Essential (primary) hypertension: Secondary | ICD-10-CM

## 2016-12-16 ENCOUNTER — Ambulatory Visit: Payer: BLUE CROSS/BLUE SHIELD | Admitting: Psychology

## 2016-12-17 DIAGNOSIS — F419 Anxiety disorder, unspecified: Secondary | ICD-10-CM | POA: Diagnosis not present

## 2016-12-23 ENCOUNTER — Ambulatory Visit: Payer: BLUE CROSS/BLUE SHIELD | Admitting: Psychology

## 2016-12-30 ENCOUNTER — Ambulatory Visit (INDEPENDENT_AMBULATORY_CARE_PROVIDER_SITE_OTHER): Payer: BLUE CROSS/BLUE SHIELD | Admitting: Psychology

## 2016-12-30 DIAGNOSIS — F321 Major depressive disorder, single episode, moderate: Secondary | ICD-10-CM

## 2017-03-10 ENCOUNTER — Ambulatory Visit: Payer: BLUE CROSS/BLUE SHIELD | Admitting: Psychology

## 2017-03-24 ENCOUNTER — Ambulatory Visit: Payer: BLUE CROSS/BLUE SHIELD | Admitting: Psychology

## 2017-03-27 ENCOUNTER — Other Ambulatory Visit: Payer: Self-pay | Admitting: Primary Care

## 2017-03-27 DIAGNOSIS — I10 Essential (primary) hypertension: Secondary | ICD-10-CM

## 2017-04-13 DIAGNOSIS — F419 Anxiety disorder, unspecified: Secondary | ICD-10-CM | POA: Diagnosis not present

## 2017-04-30 ENCOUNTER — Ambulatory Visit: Payer: BLUE CROSS/BLUE SHIELD | Admitting: Psychology

## 2017-08-12 DIAGNOSIS — F419 Anxiety disorder, unspecified: Secondary | ICD-10-CM | POA: Diagnosis not present

## 2017-10-07 DIAGNOSIS — F419 Anxiety disorder, unspecified: Secondary | ICD-10-CM | POA: Diagnosis not present

## 2018-02-10 ENCOUNTER — Other Ambulatory Visit: Payer: Self-pay | Admitting: Psychiatry

## 2018-02-10 NOTE — Telephone Encounter (Signed)
Need to review paper chart  

## 2018-02-13 ENCOUNTER — Other Ambulatory Visit: Payer: Self-pay | Admitting: Primary Care

## 2018-02-13 DIAGNOSIS — E785 Hyperlipidemia, unspecified: Secondary | ICD-10-CM

## 2018-02-14 NOTE — Telephone Encounter (Signed)
Please notify patient that she is overdue for an office follow up visit and will need this for further refills of her medications. We will send a 30 day supply of her lisinopril but must be seen for further refills. Please schedule.

## 2018-02-14 NOTE — Telephone Encounter (Signed)
Electronic refill request. Lisinopril Last office visit:   10/13/16 Last Filled:    90 tablet 3 04/27/2016  Patient not seen in > 1 year.  Please advise.

## 2018-02-15 ENCOUNTER — Telehealth: Payer: Self-pay | Admitting: Primary Care

## 2018-02-15 NOTE — Telephone Encounter (Signed)
Called pt to schedule follow up in order to keep receiving refills on prescriptions.

## 2018-02-15 NOTE — Telephone Encounter (Signed)
Sent message to Raquel Sarna to get f/u appt scheduled

## 2018-05-08 ENCOUNTER — Other Ambulatory Visit: Payer: Self-pay | Admitting: Psychiatry

## 2018-05-09 NOTE — Telephone Encounter (Signed)
Needs to set up appt  

## 2018-05-18 NOTE — Telephone Encounter (Signed)
We have left several messages, she needs appt. Last visit 10/2017

## 2018-07-06 ENCOUNTER — Other Ambulatory Visit: Payer: Self-pay | Admitting: Psychiatry

## 2018-07-06 NOTE — Telephone Encounter (Signed)
Pt overdue for appt, not seen in epic

## 2018-07-21 ENCOUNTER — Other Ambulatory Visit: Payer: Self-pay | Admitting: Psychiatry

## 2018-07-25 ENCOUNTER — Other Ambulatory Visit: Payer: Self-pay | Admitting: Psychiatry

## 2018-07-25 NOTE — Telephone Encounter (Signed)
I submitted a 30 day for her last week and was called to set up appt, nothing scheduled. Not sure her last visit. Not seen in epic.

## 2018-08-18 ENCOUNTER — Other Ambulatory Visit: Payer: Self-pay | Admitting: Psychiatry

## 2018-08-18 ENCOUNTER — Other Ambulatory Visit: Payer: Self-pay | Admitting: Primary Care

## 2018-08-18 DIAGNOSIS — I10 Essential (primary) hypertension: Secondary | ICD-10-CM

## 2018-08-18 DIAGNOSIS — E785 Hyperlipidemia, unspecified: Secondary | ICD-10-CM

## 2018-08-22 ENCOUNTER — Other Ambulatory Visit: Payer: Self-pay | Admitting: Psychiatry

## 2018-08-24 ENCOUNTER — Ambulatory Visit (INDEPENDENT_AMBULATORY_CARE_PROVIDER_SITE_OTHER): Payer: PRIVATE HEALTH INSURANCE | Admitting: Family Medicine

## 2018-08-24 ENCOUNTER — Encounter: Payer: Self-pay | Admitting: Family Medicine

## 2018-08-24 ENCOUNTER — Other Ambulatory Visit: Payer: Self-pay | Admitting: Psychiatry

## 2018-08-24 ENCOUNTER — Other Ambulatory Visit: Payer: Self-pay

## 2018-08-24 VITALS — BP 131/89 | HR 91 | Temp 98.1°F | Ht 68.0 in | Wt 142.0 lb

## 2018-08-24 DIAGNOSIS — Z124 Encounter for screening for malignant neoplasm of cervix: Secondary | ICD-10-CM

## 2018-08-24 DIAGNOSIS — D509 Iron deficiency anemia, unspecified: Secondary | ICD-10-CM

## 2018-08-24 DIAGNOSIS — E559 Vitamin D deficiency, unspecified: Secondary | ICD-10-CM | POA: Diagnosis not present

## 2018-08-24 DIAGNOSIS — I10 Essential (primary) hypertension: Secondary | ICD-10-CM

## 2018-08-24 DIAGNOSIS — F325 Major depressive disorder, single episode, in full remission: Secondary | ICD-10-CM

## 2018-08-24 DIAGNOSIS — E049 Nontoxic goiter, unspecified: Secondary | ICD-10-CM

## 2018-08-24 DIAGNOSIS — E785 Hyperlipidemia, unspecified: Secondary | ICD-10-CM

## 2018-08-24 DIAGNOSIS — Z1159 Encounter for screening for other viral diseases: Secondary | ICD-10-CM

## 2018-08-24 DIAGNOSIS — G479 Sleep disorder, unspecified: Secondary | ICD-10-CM

## 2018-08-24 DIAGNOSIS — Z7689 Persons encountering health services in other specified circumstances: Secondary | ICD-10-CM

## 2018-08-24 DIAGNOSIS — Z1239 Encounter for other screening for malignant neoplasm of breast: Secondary | ICD-10-CM

## 2018-08-24 DIAGNOSIS — F411 Generalized anxiety disorder: Secondary | ICD-10-CM

## 2018-08-24 DIAGNOSIS — Z1211 Encounter for screening for malignant neoplasm of colon: Secondary | ICD-10-CM

## 2018-08-24 DIAGNOSIS — H6121 Impacted cerumen, right ear: Secondary | ICD-10-CM

## 2018-08-24 MED ORDER — PAROXETINE HCL 40 MG PO TABS: 40.0000 mg | ORAL_TABLET | Freq: Every day | ORAL | 0 refills | Status: DC

## 2018-08-24 MED ORDER — ARIPIPRAZOLE 5 MG PO TABS: 5.0000 mg | ORAL_TABLET | Freq: Every day | ORAL | 0 refills | Status: DC

## 2018-08-24 MED ORDER — LISINOPRIL 20 MG PO TABS: 20.0000 mg | ORAL_TABLET | Freq: Every day | ORAL | 3 refills | Status: DC

## 2018-08-24 MED ORDER — ATORVASTATIN CALCIUM 40 MG PO TABS: 40.0000 mg | ORAL_TABLET | Freq: Every evening | ORAL | 3 refills | Status: DC

## 2018-08-24 MED ORDER — MIRTAZAPINE 15 MG PO TABS: 15.0000 mg | ORAL_TABLET | Freq: Every day | ORAL | 3 refills | Status: DC

## 2018-08-24 NOTE — Progress Notes (Signed)
New Patient Office Visit  Assessment & Plan:  1. Essential hypertension - Well controlled on current regimen.  - atorvastatin (LIPITOR) 40 MG tablet; Take 1 tablet (40 mg total) by mouth every evening. NEED APPOINTMENT FOR ANY MORE REFILLS  Dispense: 90 tablet; Refill: 3 - lisinopril (ZESTRIL) 20 MG tablet; Take 1 tablet (20 mg total) by mouth daily.  Dispense: 90 tablet; Refill: 3 - CMP14+EGFR - Lipid panel  2. Hyperlipidemia, unspecified hyperlipidemia type - Well controlled on current regimen.  - atorvastatin (LIPITOR) 40 MG tablet; Take 1 tablet (40 mg total) by mouth every evening. NEED APPOINTMENT FOR ANY MORE REFILLS  Dispense: 90 tablet; Refill: 3 - CMP14+EGFR - Lipid panel  3. Vitamin D deficiency - Well controlled on current regimen.  - CMP14+EGFR - VITAMIN D 25 Hydroxy (Vit-D Deficiency, Fractures)  4. Iron deficiency anemia, unspecified iron deficiency anemia type - Well controlled on current regimen.  - CBC with Differential/Platelet - CMP14+EGFR  5. Generalized anxiety disorder - Well controlled on current regimen. Medications refilled x2 months.  - CMP14+EGFR - PARoxetine (PAXIL) 40 MG tablet; Take 1 tablet (40 mg total) by mouth daily.  Dispense: 60 tablet; Refill: 0 - ARIPiprazole (ABILIFY) 5 MG tablet; Take 1 tablet (5 mg total) by mouth daily.  Dispense: 60 tablet; Refill: 0  6. Major depressive disorder with single episode, in full remission ([place]) - Well controlled on current regimen. Medications refilled x2 months. - CMP14+EGFR - PARoxetine (PAXIL) 40 MG tablet; Take 1 tablet (40 mg total) by mouth daily.  Dispense: 60 tablet; Refill: 0 - ARIPiprazole (ABILIFY) 5 MG tablet; Take 1 tablet (5 mg total) by mouth daily.  Dispense: 60 tablet; Refill: 0  7. Difficulty sleeping - Well controlled on current regimen.  - mirtazapine (REMERON) 15 MG tablet; Take 1 tablet (15 mg total) by mouth at bedtime.  Dispense: 90 tablet; Refill: 3  8. Enlarged thyroid -  TSH  9. Impacted cerumen of right ear - Encouraged to purchase Debrox wax removal kit over the counter. Drops (3-4) should be instilled twice daily x3 days, then the ear flushed with warm water on the 4th day.   10. Colon cancer screening - Patient is going to call Dr. Roseanne Kaufman office and schedule colonoscopy as she is due.   11. Breast cancer screening - Patient is going to call the [facility] and schedule her mammogram as she is due.   12. Cervical cancer screening - Patient is going to call back to schedule her pap smear as she is due.   13. Encounter to establish care   Follow-up: Return for pap smear ASAP (patient will call back to schedule).   Monica Limes, MSN, APRN, FNP-C [facility]  Subjective:  Patient ID: Monica Elliott, female    DOB: [birth year]  Age: 58 y.o. MRN: [id number]  CC:  Chief Complaint  Patient presents with  . New Patient (Initial Visit)    HPI Monica Elliott presents to establish care.    Anxiety/Depression: She does have a therapist she sees in [place], but she cannot recall the name. Feels she is doing well with her current medications. She has not been seen since November with the current pandemic and is about to run out of medications; she is requesting a 2 month supply to get her to her next appointment with the therapist.  Depression screen [facility] 2/9 08/24/2018  Decreased Interest 1  Down, Depressed, Hopeless 1  PHQ - 2 Score 2  Altered sleeping 1  Tired, decreased energy 1  Change in appetite 0  Feeling bad or failure about yourself  1  Trouble concentrating 0  Moving slowly or fidgety/restless 0  Suicidal thoughts 0  PHQ-9 Score 5    Review of Systems  Constitutional: Negative for chills, fever, malaise/fatigue and weight loss.  HENT: Negative for congestion, ear discharge, ear pain, nosebleeds, sinus pain, sore throat and tinnitus.   Eyes: Negative for blurred vision, double vision, pain,  discharge and redness.  Respiratory: Negative for cough, shortness of breath and wheezing.   Cardiovascular: Negative for chest pain, palpitations and leg swelling.  Gastrointestinal: Negative for abdominal pain, constipation, diarrhea, heartburn, nausea and vomiting.  Genitourinary: Negative for dysuria, frequency and urgency.  Musculoskeletal: Negative for myalgias.  Skin: Negative for rash.  Neurological: Negative for dizziness, seizures, weakness and headaches.  Psychiatric/Behavioral: Negative for depression, substance abuse and suicidal ideas. The patient is not nervous/anxious.      Current Outpatient Medications:  .  ARIPiprazole (ABILIFY) 5 MG tablet, Take 1 tablet (5 mg total) by mouth daily., Disp: 60 tablet, Rfl: 0 .  atorvastatin (LIPITOR) 40 MG tablet, Take 1 tablet (40 mg total) by mouth every evening. NEED APPOINTMENT FOR ANY MORE REFILLS, Disp: 90 tablet, Rfl: 3 .  BIOTIN PO, Take by mouth., Disp: , Rfl:  .  ferrous sulfate 325 (65 FE) MG tablet, Take 325 mg by mouth daily., Disp: , Rfl: 12 .  mirtazapine (REMERON) 15 MG tablet, Take 1 tablet (15 mg total) by mouth at bedtime., Disp: 90 tablet, Rfl: 3 .  PARoxetine (PAXIL) 40 MG tablet, Take 1 tablet (40 mg total) by mouth daily., Disp: 60 tablet, Rfl: 0 .  VITAMIN D PO, Take by mouth., Disp: , Rfl:  .  lisinopril (ZESTRIL) 20 MG tablet, Take 1 tablet (20 mg total) by mouth daily., Disp: 90 tablet, Rfl: 3 .  Multiple Vitamin (MULTIVITAMIN) capsule, Take 1 capsule by mouth daily., Disp: , Rfl:   No Known Allergies  Past Medical History:  Diagnosis Date  . Anemia    states low iron  . Depression   . Hypercholesterolemia   . Hypertension   . Vitamin D deficiency     Past Surgical History:  Procedure Laterality Date  . BACK SURGERY  2016  . COLONOSCOPY  04/06/2011   Colonic diverticulosis and colonic polyps-removed as described above. Polyps benign. Next colonoscopy 04/2021.  . TUBAL LIGATION      Family History   Problem Relation Age of Onset  . Heart disease Mother   . Hypertension Mother   . Diabetes Mother   . Hypertension Father   . Alcohol abuse Brother   . Diabetes Sister   . Colon cancer Neg Hx     Social History   Socioeconomic History  . Marital status: Single    Spouse name: Not on file  . Number of children: Not on file  . Years of education: Not on file  . Highest education level: Not on file  Occupational History  . Not on file  Social Needs  . Financial resource strain: Not on file  . Food insecurity    Worry: Not on file    Inability: Not on file  . Transportation needs    Medical: Not on file    Non-medical: Not on file  Tobacco Use  . Smoking status: Current Every Day Smoker    Packs/day: 0.75    Types: Cigarettes  . Smokeless tobacco: Never Used  Substance and Sexual Activity  . Alcohol use: Not Currently  . Drug use: No  . Sexual activity: Not Currently  Lifestyle  . Physical activity    Days per week: Not on file    Minutes per session: Not on file  . Stress: Not on file  Relationships  . Social Herbalist on phone: Not on file    Gets together: Not on file    Attends religious service: Not on file    Active member of club or organization: Not on file    Attends meetings of clubs or organizations: Not on file    Relationship status: Not on file  . Intimate partner violence    Fear of current or ex partner: Not on file    Emotionally abused: Not on file    Physically abused: Not on file    Forced sexual activity: Not on file  Other Topics Concern  . Not on file  Social History Narrative  . Not on file    Objective:   Today's Vitals: BP 131/89   Pulse 91   Temp 98.1 F (36.7 C) (Oral)   Ht _0  (1.727 m)   Wt 142 lb (64.4 kg)   LMP 05/10/2011   BMI 21.59 kg/m   Physical Exam Vitals signs reviewed.  Constitutional:      General: She is not in acute distress.    Appearance: Normal appearance. She is normal weight. She is  not ill-appearing, toxic-appearing or diaphoretic.  HENT:     Head: Normocephalic and atraumatic.     Right Ear: Tympanic membrane, ear canal and external ear normal. There is impacted cerumen.     Left Ear: Tympanic membrane, ear canal and external ear normal. There is no impacted cerumen.     Nose: Nose normal. No congestion or rhinorrhea.     Mouth/Throat:     Mouth: Mucous membranes are moist.     Dentition: Has dentures.     Pharynx: Oropharynx is clear. No oropharyngeal exudate or posterior oropharyngeal erythema.  Eyes:     General: No scleral icterus.       Right eye: No discharge.        Left eye: No discharge.     Conjunctiva/sclera: Conjunctivae normal.     Pupils: Pupils are equal, round, and reactive to light.  Neck:     Musculoskeletal: Normal range of motion and neck supple. No neck rigidity or muscular tenderness.     Thyroid: Thyromegaly present.  Cardiovascular:     Rate and Rhythm: Normal rate and regular rhythm.     Heart sounds: Normal heart sounds. No murmur. No friction rub. No gallop.   Pulmonary:     Effort: Pulmonary effort is normal. No respiratory distress.     Breath sounds: Normal breath sounds. No stridor. No wheezing, rhonchi or rales.  Musculoskeletal: Normal range of motion.  Lymphadenopathy:     Cervical: No cervical adenopathy.  Skin:    General: Skin is warm and dry.     Capillary Refill: Capillary refill takes less than 2 seconds.  Neurological:     General: No focal deficit present.     Mental Status: She is alert and oriented to person, place, and time. Mental status is at baseline.  Psychiatric:        Mood and Affect: Mood normal.        Behavior: Behavior normal.        Thought Content: Thought content normal.  Judgment: Judgment normal.

## 2018-08-24 NOTE — Patient Instructions (Addendum)
Debrox Wax Removal Kit over the counter. Place 3-4 drops in your right ear twice daily x3 days, then flush with water water on day 4.   Schedule colonoscopy with Dr. Gala Romney.  Schedule mammogram at the [facility].  Call back and schedule pap smear with me.    Preventive Care 60-61 Years Old, Female Preventive care refers to visits with your health care provider and lifestyle choices that can promote health and wellness. This includes:  A yearly physical exam. This may also be called an annual well check.  Regular dental visits and eye exams.  Immunizations.  Screening for certain conditions.  Healthy lifestyle choices, such as eating a healthy diet, getting regular exercise, not using drugs or products that contain nicotine and tobacco, and limiting alcohol use. What can I expect for my preventive care visit? Physical exam Your health care provider will check your:  Height and weight. This may be used to calculate body mass index (BMI), which tells if you are at a healthy weight.  Heart rate and blood pressure.  Skin for abnormal spots. Counseling Your health care provider may ask you questions about your:  Alcohol, tobacco, and drug use.  Emotional well-being.  Home and relationship well-being.  Sexual activity.  Eating habits.  Work and work Statistician.  Method of birth control.  Menstrual cycle.  Pregnancy history. What immunizations do I need?  Influenza (flu) vaccine  This is recommended every year. Tetanus, diphtheria, and pertussis (Tdap) vaccine  You may need a Td booster every 10 years. Varicella (chickenpox) vaccine  You may need this if you have not been vaccinated. Zoster (shingles) vaccine  You may need this after age 60. Measles, mumps, and rubella (MMR) vaccine  You may need at least one dose of MMR if you were born in 1957 or later. You may also need a second dose. Pneumococcal conjugate (PCV13) vaccine  You may need this if you  have certain conditions and were not previously vaccinated. Pneumococcal polysaccharide (PPSV23) vaccine  You may need one or two doses if you smoke cigarettes or if you have certain conditions. Meningococcal conjugate (MenACWY) vaccine  You may need this if you have certain conditions. Hepatitis A vaccine  You may need this if you have certain conditions or if you travel or work in places where you may be exposed to hepatitis A. Hepatitis B vaccine  You may need this if you have certain conditions or if you travel or work in places where you may be exposed to hepatitis B. Haemophilus influenzae type b (Hib) vaccine  You may need this if you have certain conditions. Human papillomavirus (HPV) vaccine  If recommended by your health care provider, you may need three doses over 6 months. You may receive vaccines as individual doses or as more than one vaccine together in one shot (combination vaccines). Talk with your health care provider about the risks and benefits of combination vaccines. What tests do I need? Blood tests  Lipid and cholesterol levels. These may be checked every 5 years, or more frequently if you are over 60 years old.  Hepatitis C test.  Hepatitis B test. Screening  Lung cancer screening. You may have this screening every year starting at age 60 if you have a 30-pack-year history of smoking and currently smoke or have quit within the past 15 years.  Colorectal cancer screening. All adults should have this screening starting at age 46 and continuing until age 60. Your health care provider may recommend  screening at age 60 if you are at increased risk. You will have tests every 1-10 years, depending on your results and the type of screening test.  Diabetes screening. This is done by checking your blood sugar (glucose) after you have not eaten for a while (fasting). You may have this done every 1-3 years.  Mammogram. This may be done every 1-2 years. Talk with your  health care provider about when you should start having regular mammograms. This may depend on whether you have a family history of breast cancer.  BRCA-related cancer screening. This may be done if you have a family history of breast, ovarian, tubal, or peritoneal cancers.  Pelvic exam and Pap test. This may be done every 3 years starting at age 60. Starting at age 60, this may be done every 5 years if you have a Pap test in combination with an HPV test. Other tests  Sexually transmitted disease (STD) testing.  Bone density scan. This is done to screen for osteoporosis. You may have this scan if you are at high risk for osteoporosis. Follow these instructions at home: Eating and drinking  Eat a diet that includes fresh fruits and vegetables, whole grains, lean protein, and low-fat dairy.  Take vitamin and mineral supplements as recommended by your health care provider.  Do not drink alcohol if: ? Your health care provider tells you not to drink. ? You are pregnant, may be pregnant, or are planning to become pregnant.  If you drink alcohol: ? Limit how much you have to 0-1 drink a day. ? Be aware of how much alcohol is in your drink. In the [place] one drink equals one 12 oz bottle of beer (355 mL), one 5 oz glass of wine (148 mL), or one 1 oz glass of hard liquor (44 mL). Lifestyle  Take daily care of your teeth and gums.  Stay active. Exercise for at least 30 minutes on 5 or more days each week.  Do not use any products that contain nicotine or tobacco, such as cigarettes, e-cigarettes, and chewing tobacco. If you need help quitting, ask your health care provider.  If you are sexually active, practice safe sex. Use a condom or other form of birth control (contraception) in order to prevent pregnancy and STIs (sexually transmitted infections).  If told by your health care provider, take low-dose aspirin daily starting at age 23. What's next?  Visit your health care provider once  a year for a well check visit.  Ask your health care provider how often you should have your eyes and teeth checked.  Stay up to date on all vaccines. This information is not intended to replace advice given to you by your health care provider. Make sure you discuss any questions you have with your health care provider. Document Released: 02/15/2015 Document Revised: 09/30/2017 Document Reviewed: 09/30/2017 Elsevier Patient Education  2020 [employer].

## 2018-08-30 NOTE — Progress Notes (Signed)
Blood work was ordered but looks like it was never collected from patient.

## 2018-09-02 LAB — CBC WITH DIFFERENTIAL/PLATELET

## 2018-09-02 LAB — CMP14+EGFR
ALT: 12 IU/L (ref 0–32)
AST: 16 IU/L (ref 0–40)
Albumin/Globulin Ratio: 2.4 — ABNORMAL HIGH (ref 1.2–2.2)
Albumin: 4.7 g/dL (ref 3.8–4.9)
Alkaline Phosphatase: 73 IU/L (ref 39–117)
BUN/Creatinine Ratio: 16 (ref 12–28)
BUN: 11 mg/dL (ref 8–27)
Bilirubin Total: 0.2 mg/dL (ref 0.0–1.2)
CO2: 20 mmol/L (ref 20–29)
Calcium: 9.3 mg/dL (ref 8.7–10.3)
Chloride: 103 mmol/L (ref 96–106)
Creatinine, Ser: 0.67 mg/dL (ref 0.57–1.00)
GFR calc Af Amer: 110 mL/min/{1.73_m2} (ref >59)
GFR calc non Af Amer: 96 mL/min/{1.73_m2} (ref >59)
Globulin, Total: 2 g/dL (ref 1.5–4.5)
Glucose: 86 mg/dL (ref 65–99)
Potassium: 4.2 mmol/L (ref 3.5–5.2)
Sodium: 141 mmol/L (ref 134–144)
Total Protein: 6.7 g/dL (ref 6.0–8.5)

## 2018-09-02 LAB — LIPID PANEL
Chol/HDL Ratio: 4.3 ratio (ref 0.0–4.4)
Cholesterol, Total: 260 mg/dL — ABNORMAL HIGH (ref 100–199)
HDL: 60 mg/dL (ref >39)
LDL Calculated: 183 mg/dL — ABNORMAL HIGH (ref 0–99)
Triglycerides: 86 mg/dL (ref 0–149)
VLDL Cholesterol Cal: 17 mg/dL (ref 5–40)

## 2018-09-02 LAB — TSH: TSH: 0.879 u[IU]/mL (ref 0.450–4.500)

## 2018-09-02 LAB — VITAMIN D 25 HYDROXY (VIT D DEFICIENCY, FRACTURES): Vit D, 25-Hydroxy: 14.3 ng/mL — ABNORMAL LOW (ref 30.0–100.0)

## 2018-09-03 LAB — SPECIMEN STATUS REPORT

## 2018-09-03 LAB — HEPATITIS C ANTIBODY: Hep C Virus Ab: <0.1 s/co ratio (ref 0.0–0.9)

## 2018-09-17 ENCOUNTER — Other Ambulatory Visit: Payer: Self-pay | Admitting: Psychiatry

## 2018-09-17 DIAGNOSIS — F325 Major depressive disorder, single episode, in full remission: Secondary | ICD-10-CM

## 2018-09-17 DIAGNOSIS — F411 Generalized anxiety disorder: Secondary | ICD-10-CM

## 2018-10-01 ENCOUNTER — Other Ambulatory Visit: Payer: Self-pay | Admitting: Family Medicine

## 2018-10-01 DIAGNOSIS — F325 Major depressive disorder, single episode, in full remission: Secondary | ICD-10-CM

## 2018-10-01 DIAGNOSIS — F411 Generalized anxiety disorder: Secondary | ICD-10-CM

## 2018-10-17 ENCOUNTER — Other Ambulatory Visit: Payer: Self-pay | Admitting: Family Medicine

## 2018-10-17 DIAGNOSIS — F411 Generalized anxiety disorder: Secondary | ICD-10-CM

## 2018-10-17 DIAGNOSIS — F325 Major depressive disorder, single episode, in full remission: Secondary | ICD-10-CM

## 2018-11-14 ENCOUNTER — Other Ambulatory Visit: Payer: Self-pay

## 2018-11-14 DIAGNOSIS — Z20822 Contact with and (suspected) exposure to covid-19: Secondary | ICD-10-CM

## 2018-11-15 LAB — NOVEL CORONAVIRUS, NAA: SARS-CoV-2, NAA: NOT DETECTED

## 2018-11-15 LAB — SPECIMEN STATUS REPORT

## 2018-11-18 ENCOUNTER — Telehealth: Payer: Self-pay | Admitting: Family Medicine

## 2018-11-18 NOTE — Telephone Encounter (Signed)
Please call patient and let her know her mammogram has been scheduled for October 30th at 9:30 AM at the [facility] in [place]. She should arrive at 9:15 AM for registration. No powders, perfumes, or deodorant. If she does not work for her she or we can call back to reschedule; it will be out to December as this was the only appointment before then.

## 2018-11-18 NOTE — Telephone Encounter (Signed)
Lm with details on VM

## 2019-01-02 ENCOUNTER — Other Ambulatory Visit: Payer: Self-pay | Admitting: Family Medicine

## 2019-01-02 DIAGNOSIS — F411 Generalized anxiety disorder: Secondary | ICD-10-CM

## 2019-01-02 DIAGNOSIS — F325 Major depressive disorder, single episode, in full remission: Secondary | ICD-10-CM

## 2019-01-03 NOTE — Telephone Encounter (Signed)
Patient was only getting a two month supply from me until she could get back in with her psychiatrist. Has she been able to do this?

## 2019-01-03 NOTE — Telephone Encounter (Signed)
Lm to call us back [id number]

## 2019-01-10 ENCOUNTER — Telehealth: Payer: Self-pay | Admitting: Family Medicine

## 2019-01-10 DIAGNOSIS — F325 Major depressive disorder, single episode, in full remission: Secondary | ICD-10-CM

## 2019-01-10 DIAGNOSIS — F411 Generalized anxiety disorder: Secondary | ICD-10-CM

## 2019-01-10 MED ORDER — ARIPIPRAZOLE 5 MG PO TABS: 5.0000 mg | ORAL_TABLET | Freq: Every day | ORAL | 2 refills | Status: DC

## 2019-01-10 NOTE — Telephone Encounter (Signed)
Pt states she never called the psych doctor to schedule appt. She says she has been meaning to and wants to know if you can refill the Abilify?

## 2019-01-10 NOTE — Telephone Encounter (Signed)
Pt advised of provider feedback and voiced understanding. I advised pt she needs to call today and schedule appt. Pt states she will.

## 2019-01-10 NOTE — Telephone Encounter (Signed)
Please encourage patient to call and schedule appointment with psychiatrist. I did refill for now.

## 2019-01-21 ENCOUNTER — Other Ambulatory Visit: Payer: Self-pay | Admitting: Family Medicine

## 2019-01-21 DIAGNOSIS — F325 Major depressive disorder, single episode, in full remission: Secondary | ICD-10-CM

## 2019-01-21 DIAGNOSIS — F411 Generalized anxiety disorder: Secondary | ICD-10-CM

## 2019-02-11 ENCOUNTER — Other Ambulatory Visit: Payer: Self-pay | Admitting: Family Medicine

## 2019-02-11 DIAGNOSIS — F411 Generalized anxiety disorder: Secondary | ICD-10-CM

## 2019-02-11 DIAGNOSIS — F325 Major depressive disorder, single episode, in full remission: Secondary | ICD-10-CM

## 2019-04-23 ENCOUNTER — Other Ambulatory Visit: Payer: Self-pay | Admitting: Family Medicine

## 2019-04-23 DIAGNOSIS — F325 Major depressive disorder, single episode, in full remission: Secondary | ICD-10-CM

## 2019-04-23 DIAGNOSIS — F411 Generalized anxiety disorder: Secondary | ICD-10-CM

## 2019-06-23 ENCOUNTER — Ambulatory Visit: Payer: Medicaid Other | Attending: Internal Medicine

## 2019-06-23 DIAGNOSIS — Z23 Encounter for immunization: Secondary | ICD-10-CM

## 2019-06-23 NOTE — Progress Notes (Signed)
   Covid-19 Vaccination Clinic  Name:  Monica Elliott    MRN: [id number] DOB: Jan 21, 1961  06/23/2019  Monica Elliott was observed post Covid-19 immunization for 15 minutes without incident. She was provided with Vaccine Information Sheet and instruction to access the V-Safe system.   Monica Elliott was instructed to call 911 with any severe reactions post vaccine: Marland Kitchen Difficulty breathing  . Swelling of face and throat  . A fast heartbeat  . A bad rash all over body  . Dizziness and weakness   Immunizations Administered    Name Date Dose VIS Date Route   Pfizer COVID-19 Vaccine 06/23/2019 10:05 AM 0.3 mL 03/29/2018 Intramuscular   Manufacturer: [employer], [employer]   Lot: [id number]   [place]: [id number]

## 2019-07-14 ENCOUNTER — Ambulatory Visit: Payer: Medicaid Other | Attending: Internal Medicine

## 2019-07-14 DIAGNOSIS — Z23 Encounter for immunization: Secondary | ICD-10-CM

## 2019-07-14 NOTE — Progress Notes (Signed)
   Covid-[facility] Vaccination [facility]  Name:  Monica Elliott    MRN: [id number] DOB: 1960/08/30  07/14/2019  [name] was observed post Covid-19 immunization for 15 minutes without incident. She was provided with Vaccine Information Sheet and instruction to access the V-Safe system.   [name] was instructed to call 911 with any severe reactions post vaccine: Marland Kitchen Difficulty breathing  . Swelling of face and throat  . A fast heartbeat  . A bad rash all over body  . Dizziness and weakness   Immunizations Administered    Name Date Dose VIS Date Route   Pfizer COVID-19 Vaccine 07/14/2019  9:25 AM 0.3 mL 03/29/2018 Intramuscular   Manufacturer: [employer], [employer]   Lot: [id number]   [place]: [id number]

## 2019-11-09 ENCOUNTER — Other Ambulatory Visit: Payer: Self-pay | Admitting: Family Medicine

## 2019-11-09 DIAGNOSIS — I10 Essential (primary) hypertension: Secondary | ICD-10-CM

## 2020-03-22 ENCOUNTER — Other Ambulatory Visit: Payer: Self-pay

## 2020-03-22 ENCOUNTER — Other Ambulatory Visit (HOSPITAL_COMMUNITY)
Admission: RE | Admit: 2020-03-22 | Discharge: 2020-03-22 | Disposition: A | Discharge status: Home / Self Care | Payer: Medicaid Other | Source: Ambulatory Visit | Attending: Family Medicine | Admitting: Family Medicine

## 2020-03-22 ENCOUNTER — Encounter: Payer: Self-pay | Admitting: Family Medicine

## 2020-03-22 ENCOUNTER — Ambulatory Visit (INDEPENDENT_AMBULATORY_CARE_PROVIDER_SITE_OTHER): Payer: 59 | Admitting: Family Medicine

## 2020-03-22 VITALS — BP 119/86 | HR 113 | Temp 97.6°F | Ht 68.0 in | Wt 176.8 lb

## 2020-03-22 DIAGNOSIS — E785 Hyperlipidemia, unspecified: Secondary | ICD-10-CM

## 2020-03-22 DIAGNOSIS — Z124 Encounter for screening for malignant neoplasm of cervix: Secondary | ICD-10-CM | POA: Insufficient documentation

## 2020-03-22 DIAGNOSIS — Z113 Encounter for screening for infections with a predominantly sexual mode of transmission: Secondary | ICD-10-CM | POA: Insufficient documentation

## 2020-03-22 DIAGNOSIS — E782 Mixed hyperlipidemia: Secondary | ICD-10-CM | POA: Diagnosis not present

## 2020-03-22 DIAGNOSIS — F325 Major depressive disorder, single episode, in full remission: Secondary | ICD-10-CM

## 2020-03-22 DIAGNOSIS — E559 Vitamin D deficiency, unspecified: Secondary | ICD-10-CM

## 2020-03-22 DIAGNOSIS — D509 Iron deficiency anemia, unspecified: Secondary | ICD-10-CM

## 2020-03-22 DIAGNOSIS — Z1151 Encounter for screening for human papillomavirus (HPV): Secondary | ICD-10-CM | POA: Insufficient documentation

## 2020-03-22 DIAGNOSIS — Z720 Tobacco use: Secondary | ICD-10-CM | POA: Insufficient documentation

## 2020-03-22 DIAGNOSIS — I10 Essential (primary) hypertension: Secondary | ICD-10-CM

## 2020-03-22 DIAGNOSIS — R631 Polydipsia: Secondary | ICD-10-CM

## 2020-03-22 DIAGNOSIS — Z0001 Encounter for general adult medical examination with abnormal findings: Secondary | ICD-10-CM

## 2020-03-22 DIAGNOSIS — Z23 Encounter for immunization: Secondary | ICD-10-CM

## 2020-03-22 DIAGNOSIS — F411 Generalized anxiety disorder: Secondary | ICD-10-CM

## 2020-03-22 DIAGNOSIS — Z0000 Encounter for general adult medical examination without abnormal findings: Secondary | ICD-10-CM

## 2020-03-22 DIAGNOSIS — Z1211 Encounter for screening for malignant neoplasm of colon: Secondary | ICD-10-CM

## 2020-03-22 LAB — BAYER DCA HB A1C WAIVED: HB A1C (BAYER DCA - WAIVED): 5.5 % (ref <7.0)

## 2020-03-22 MED ORDER — LISINOPRIL 20 MG PO TABS: 20.0000 mg | ORAL_TABLET | Freq: Every day | ORAL | 3 refills | Status: DC

## 2020-03-22 MED ORDER — ATORVASTATIN CALCIUM 40 MG PO TABS
40.0000 mg | ORAL_TABLET | Freq: Every evening | ORAL | 3 refills | Status: DC
Start: 2020-03-22 — End: 2021-02-24

## 2020-03-22 NOTE — Patient Instructions (Signed)
High Cholesterol  High cholesterol is a condition in which the blood has high levels of a white, waxy substance similar to fat (cholesterol). The liver makes all the cholesterol that the body needs. The human body needs small amounts of cholesterol to help build cells. A person gets extra or excess cholesterol from the food that he or she eats. The blood carries cholesterol from the liver to the rest of the body. If you have high cholesterol, deposits (plaques) may build up on the walls of your arteries. Arteries are the blood vessels that carry blood away from your heart. These plaques make the arteries narrow and stiff. Cholesterol plaques increase your risk for heart attack and stroke. Work with your health care provider to keep your cholesterol levels in a healthy range. What increases the risk? The following factors may make you more likely to develop this condition:  Eating foods that are high in animal fat (saturated fat) or cholesterol.  Being overweight.  Not getting enough exercise.  A family history of high cholesterol (familial hypercholesterolemia).  Use of tobacco products.  Having diabetes. What are the signs or symptoms? There are no symptoms of this condition. How is this diagnosed? This condition may be diagnosed based on the results of a blood test.  If you are older than 62 years of age, your health care provider may check your cholesterol levels every 4-6 years.  You may be checked more often if you have high cholesterol or other risk factors for heart disease. The blood test for cholesterol measures:  "Bad" cholesterol, or LDL cholesterol. This is the main type of cholesterol that causes heart disease. The desired level is less than 100 mg/dL.  "Good" cholesterol, or HDL cholesterol. HDL helps protect against heart disease by cleaning the arteries and carrying the LDL to the liver for processing. The desired level for HDL is 60 mg/dL or higher.  Triglycerides.  These are fats that your body can store or burn for energy. The desired level is less than 150 mg/dL.  Total cholesterol. This measures the total amount of cholesterol in your blood and includes LDL, HDL, and triglycerides. The desired level is less than 200 mg/dL. How is this treated? This condition may be treated with:  Diet changes. You may be asked to eat foods that have more fiber and less saturated fats or added sugar.  Lifestyle changes. These may include regular exercise, maintaining a healthy weight, and quitting use of tobacco products.  Medicines. These are given when diet and lifestyle changes have not worked. You may be prescribed a statin medicine to help lower your cholesterol levels. Follow these instructions at home: Eating and drinking  Eat a healthy, balanced diet. This diet includes: ? Daily servings of a variety of fresh, frozen, or canned fruits and vegetables. ? Daily servings of whole grain foods that are rich in fiber. ? Foods that are low in saturated fats and trans fats. These include poultry and fish without skin, lean cuts of meat, and low-fat dairy products. ? A variety of fish, especially oily fish that contain omega-3 fatty acids. Aim to eat fish at least 2 times a week.  Avoid foods and drinks that have added sugar.  Use healthy cooking methods, such as roasting, grilling, broiling, baking, poaching, steaming, and stir-frying. Do not fry your food except for stir-frying.   Lifestyle  Get regular exercise. Aim to exercise for a total of 150 minutes a week. Increase your activity level by doing activities  such as gardening, walking, and taking the stairs.  Do not use any products that contain nicotine or tobacco, such as cigarettes, e-cigarettes, and chewing tobacco. If you need help quitting, ask your health care provider.   General instructions  Take over-the-counter and prescription medicines only as told by your health care provider.  Keep all  follow-up visits as told by your health care provider. This is important. Where to find more information  American Heart Association: www.heart.org  National Heart, Lung, and Blood Institute: https://wilson-eaton.com/ Contact a health care provider if:  You have trouble achieving or maintaining a healthy diet or weight.  You are starting an exercise program.  You are unable to stop smoking. Get help right away if:  You have chest pain.  You have trouble breathing.  You have any symptoms of a stroke. "BE FAST" is an easy way to remember the main warning signs of a stroke: ? B - Balance. Signs are dizziness, sudden trouble walking, or loss of balance. ? E - Eyes. Signs are trouble seeing or a sudden change in vision. ? F - Face. Signs are sudden weakness or numbness of the face, or the face or eyelid drooping on one side. ? A - Arms. Signs are weakness or numbness in an arm. This happens suddenly and usually on one side of the body. ? S - Speech. Signs are sudden trouble speaking, slurred speech, or trouble understanding what people say. ? T - Time. Time to call emergency services. Write down what time symptoms started.  You have other signs of a stroke, such as: ? A sudden, severe headache with no known cause. ? Nausea or vomiting. ? Seizure. These symptoms may represent a serious problem that is an emergency. Do not wait to see if the symptoms will go away. Get medical help right away. Call your local emergency services (911 in the [place] Do not drive yourself to the hospital. Summary  Cholesterol plaques increase your risk for heart attack and stroke. Work with your health care provider to keep your cholesterol levels in a healthy range.  Eat a healthy, balanced diet, get regular exercise, and maintain a healthy weight.  Do not use any products that contain nicotine or tobacco, such as cigarettes, e-cigarettes, and chewing tobacco.  Get help right away if you have any symptoms of a  stroke. This information is not intended to replace advice given to you by your health care provider. Make sure you discuss any questions you have with your health care provider. Document Revised: 12/19/2018 Document Reviewed: 12/19/2018 Elsevier Patient Education  2021 Elsevier Inc.   Preventive Care 77-65 Years Old, Female Preventive care refers to lifestyle choices and visits with your health care provider that can promote health and wellness. This includes:  A yearly physical exam. This is also called an annual wellness visit.  Regular dental and eye exams.  Immunizations.  Screening for certain conditions.  Healthy lifestyle choices, such as: ? Eating a healthy diet. ? Getting regular exercise. ? Not using drugs or products that contain nicotine and tobacco. ? Limiting alcohol use. What can I expect for my preventive care visit? Physical exam Your health care provider will check your:  Height and weight. These may be used to calculate your BMI (body mass index). BMI is a measurement that tells if you are at a healthy weight.  Heart rate and blood pressure.  Body temperature.  Skin for abnormal spots. Counseling Your health care provider may ask you questions  about your:  Past medical problems.  Family's medical history.  Alcohol, tobacco, and drug use.  Emotional well-being.  Home life and relationship well-being.  Sexual activity.  Diet, exercise, and sleep habits.  Work and work Statistician.  Access to firearms.  Method of birth control.  Menstrual cycle.  Pregnancy history. What immunizations do I need? Vaccines are usually given at various ages, according to a schedule. Your health care provider will recommend vaccines for you based on your age, medical history, and lifestyle or other factors, such as travel or where you work.   What tests do I need? Blood tests  Lipid and cholesterol levels. These may be checked every 5 years, or more often  if you are over 74 years old.  Hepatitis C test.  Hepatitis B test. Screening  Lung cancer screening. You may have this screening every year starting at age 70 if you have a 30-pack-year history of smoking and currently smoke or have quit within the past 15 years.  Colorectal cancer screening. ? All adults should have this screening starting at age 60 and continuing until age 91. ? Your health care provider may recommend screening at age 65 if you are at increased risk. ? You will have tests every 1-10 years, depending on your results and the type of screening test.  Diabetes screening. ? This is done by checking your blood sugar (glucose) after you have not eaten for a while (fasting). ? You may have this done every 1-3 years.  Mammogram. ? This may be done every 1-2 years. ? Talk with your health care provider about when you should start having regular mammograms. This may depend on whether you have a family history of breast cancer.  BRCA-related cancer screening. This may be done if you have a family history of breast, ovarian, tubal, or peritoneal cancers.  Pelvic exam and Pap test. ? This may be done every 3 years starting at age 39. ? Starting at age 61, this may be done every 5 years if you have a Pap test in combination with an HPV test. Other tests  STD (sexually transmitted disease) testing, if you are at risk.  Bone density scan. This is done to screen for osteoporosis. You may have this scan if you are at high risk for osteoporosis. Talk with your health care provider about your test results, treatment options, and if necessary, the need for more tests. Follow these instructions at home: Eating and drinking  Eat a diet that includes fresh fruits and vegetables, whole grains, lean protein, and low-fat dairy products.  Take vitamin and mineral supplements as recommended by your health care provider.  Do not drink alcohol if: ? Your health care provider tells you  not to drink. ? You are pregnant, may be pregnant, or are planning to become pregnant.  If you drink alcohol: ? Limit how much you have to 0-1 drink a day. ? Be aware of how much alcohol is in your drink. In the [place] one drink equals one 12 oz bottle of beer (355 mL), one 5 oz glass of wine (148 mL), or one 1 oz glass of hard liquor (44 mL).   Lifestyle  Take daily care of your teeth and gums. Brush your teeth every morning and night with fluoride toothpaste. Floss one time each day.  Stay active. Exercise for at least 30 minutes 5 or more days each week.  Do not use any products that contain nicotine or tobacco, such as cigarettes,  e-cigarettes, and chewing tobacco. If you need help quitting, ask your health care provider.  Do not use drugs.  If you are sexually active, practice safe sex. Use a condom or other form of protection to prevent STIs (sexually transmitted infections).  If you do not wish to become pregnant, use a form of birth control. If you plan to become pregnant, see your health care provider for a prepregnancy visit.  If told by your health care provider, take low-dose aspirin daily starting at age 73.  Find healthy ways to cope with stress, such as: ? Meditation, yoga, or listening to music. ? Journaling. ? Talking to a trusted person. ? Spending time with friends and family. Safety  Always wear your seat belt while driving or riding in a vehicle.  Do not drive: ? If you have been drinking alcohol. Do not ride with someone who has been drinking. ? When you are tired or distracted. ? While texting.  Wear a helmet and other protective equipment during sports activities.  If you have firearms in your house, make sure you follow all gun safety procedures. What's next?  Visit your health care provider once a year for an annual wellness visit.  Ask your health care provider how often you should have your eyes and teeth checked.  Stay up to date on all  vaccines. This information is not intended to replace advice given to you by your health care provider. Make sure you discuss any questions you have with your health care provider. Document Revised: 10/24/2019 Document Reviewed: 09/30/2017 Elsevier Patient Education  2021 [employer].

## 2020-03-22 NOTE — Addendum Note (Signed)
Addended by: Karle Plumber on: 03/22/2020 03:12 PM   Modules accepted: Orders

## 2020-03-22 NOTE — Progress Notes (Signed)
Assessment & Plan:  1. Well adult exam Preventive health education provided. Pap completed today. Mammo set up on bus. Referred for colonoscopy. TDAP given in office today. Patient declined HIV screening,  influenza and Shingrix.  - TSH - CMP14+EGFR - CBC with Differential/Platelet - Lipid panel  2. Essential hypertension Well controlled on current regimen.  - lisinopril (ZESTRIL) 20 MG tablet; Take 1 tablet (20 mg total) by mouth daily.  Dispense: 90 tablet; Refill: 3 - TSH - CMP14+EGFR - CBC with Differential/Platelet - atorvastatin (LIPITOR) 40 MG tablet; Take 1 tablet (40 mg total) by mouth every evening.  Dispense: 90 tablet; Refill: 3  3. Mixed hyperlipidemia Labs to assess.  Started patient back on atorvastatin due to ASCVD risk score of 13.8%.  Education provided on hyperlipidemia. - CMP14+EGFR - Lipid panel - atorvastatin (LIPITOR) 40 MG tablet; Take 1 tablet (40 mg total) by mouth every evening.  Dispense: 90 tablet; Refill: 3  4. Tobacco use Encouraged to stop smoking. - CMP14+EGFR - CBC with Differential/Platelet - Lipid panel  5. Major depressive disorder with single episode, in full remission ([place]) Well controlled on current regimen.  - CMP14+EGFR  6. Generalized anxiety disorder Well controlled on current regimen.  - CMP14+EGFR  7. Iron deficiency anemia, unspecified iron deficiency anemia type Labs to assess. Taking supplement. - CBC with Differential/Platelet - Anemia Profile B  8. Vitamin D deficiency Labs to assess. Taking supplement. - VITAMIN D 25 Hydroxy (Vit-D Deficiency, Fractures)  9. Polydipsia - Bayer DCA Hb A1c Waived  10. Screening for cervical cancer - Cytology - PAP  11. Routine screening for STI (sexually transmitted infection) - Cytology - PAP  12. Screening for human papillomavirus (HPV) - Cytology - PAP  13. Colon cancer screening - Referred to gastroenterology   Follow-up: Return in about 4 months (around 07/20/2020)  for follow-up of chronic medication conditions.   Hendricks Limes, MSN, APRN, FNP-C [facility]  Subjective:  Patient ID: Monica Elliott, female    DOB: [birth year]  Age: 60 y.o. MRN: [id number]  Patient Care Team: Loman Brooklyn, FNP as PCP - General (Family Medicine) Gala Romney Cristopher Estimable, MD (Gastroenterology) Reynold Bowen, NT as Technician (Internal Medicine)   CC:  Chief Complaint  Patient presents with  . Gynecologic Exam    HPI Monica Elliott presents for her annual physical.  Occupation: Deli associate at [employer], Marital status: Widowed, Substance use: None Last eye exam: Last year Last dental exam: Does not go, has dentures Last colonoscopy: 04/06/2011 - to repeat in 5 years due to polyps Last mammogram: 06/17/2015 Last pap smear: 08/12/2015 Hepatitis C Screening: 08/30/2018 (negative) Immunizations: Flu Vaccine: declined Tdap Vaccine: will get today  Shingrix Vaccine: declined  COVID-19 Vaccine: up to date  Hypertension: Doing well with lisinopril.  Hyperlipidemia: Patient ran out of atorvastatin and was unable to get a refill until she came in for an appointment.  Iron deficiency anemia: Patient takes iron supplement daily.  Vitamin D deficiency: Patient takes 1000 units once daily.  She does feel she was doing this when her last vitamin D level was drawn.  Anxiety/depression: Patient feels she is doing well.  Depression screen [facility] 2/9 03/22/2020 08/24/2018  Decreased Interest 0 1  Down, Depressed, Hopeless 0 1  PHQ - 2 Score 0 2  Altered sleeping 0 1  Tired, decreased energy 1 1  Change in appetite 1 0  Feeling bad or failure about yourself  0 1  Trouble concentrating  0 0  Moving slowly or fidgety/restless 0 0  Suicidal thoughts 0 0  PHQ-9 Score 2 5  Difficult doing work/chores Not difficult at all -   GAD 7 : Generalized Anxiety Score 03/22/2020  Nervous, Anxious, on Edge 1  Control/stop worrying 0  Worry too much - different  things 1  Trouble relaxing 0  Restless 0  Easily annoyed or irritable 0  Afraid - awful might happen 0  Total GAD 7 Score 2  Anxiety Difficulty Not difficult at all    Review of Systems  Constitutional: Negative for chills, fever, malaise/fatigue and weight loss.       Increased thirst  HENT: Negative for congestion, ear discharge, ear pain, nosebleeds, sinus pain, sore throat and tinnitus.   Eyes: Negative for blurred vision, double vision, pain, discharge and redness.  Respiratory: Negative for cough, shortness of breath and wheezing.   Cardiovascular: Negative for chest pain, palpitations and leg swelling.  Gastrointestinal: Negative for abdominal pain, constipation, diarrhea, heartburn, nausea and vomiting.  Genitourinary: Positive for frequency. Negative for dysuria and urgency.  Musculoskeletal: Negative for myalgias.  Skin: Negative for rash.  Neurological: Negative for dizziness, seizures, weakness and headaches.  Psychiatric/Behavioral: Negative for depression, substance abuse and suicidal ideas. The patient is not nervous/anxious.      Current Outpatient Medications:  .  BIOTIN PO, Take by mouth., Disp: , Rfl:  .  ferrous sulfate 325 (65 FE) MG tablet, Take 325 mg by mouth daily., Disp: , Rfl: 12 .  lisinopril (ZESTRIL) 20 MG tablet, Take 1 tablet (20 mg total) by mouth daily., Disp: 90 tablet, Rfl: 3 .  Multiple Vitamin (MULTIVITAMIN) capsule, Take 1 capsule by mouth daily., Disp: , Rfl:  .  VITAMIN D PO, Take by mouth., Disp: , Rfl:   No Known Allergies  Past Medical History:  Diagnosis Date  . Anemia    states low iron  . Depression   . Hypercholesterolemia   . Hypertension   . Vitamin D deficiency     Past Surgical History:  Procedure Laterality Date  . BACK SURGERY  2016  . COLONOSCOPY  04/06/2011   Colonic diverticulosis and colonic polyps-removed as described above. Polyps benign. Next colonoscopy 04/2021.  . TUBAL LIGATION      Family History   Problem Relation Age of Onset  . Heart disease Mother   . Hypertension Mother   . Diabetes Mother   . Hypertension Father   . Alcohol abuse Brother   . Diabetes Sister   . Colon cancer Neg Hx     Social History   Socioeconomic History  . Marital status: Single    Spouse name: Not on file  . Number of children: Not on file  . Years of education: Not on file  . Highest education level: Not on file  Occupational History  . Not on file  Tobacco Use  . Smoking status: Current Every Day Smoker    Packs/day: 0.75    Types: Cigarettes  . Smokeless tobacco: Never Used  Vaping Use  . Vaping Use: Never used  Substance and Sexual Activity  . Alcohol use: Not Currently  . Drug use: No  . Sexual activity: Not Currently  Other Topics Concern  . Not on file  Social History Narrative  . Not on file   Social Determinants of Health   Financial Resource Strain: Not on file  Food Insecurity: Not on file  Transportation Needs: Not on file  Physical Activity: Not on file  Stress: Not on file  Social Connections: Not on file  Intimate Partner Violence: Not on file      Objective:    BP 119/86   Pulse (!) 113   Temp 97.6 F (36.4 C) (Temporal)   Ht 5' 8" (1.727 m)   Wt 176 lb 12.8 oz (80.2 kg)   LMP 05/10/2011   SpO2 95%   BMI 26.88 kg/m   Wt Readings from Last 3 Encounters:  03/22/20 176 lb 12.8 oz (80.2 kg)  08/24/18 142 lb (64.4 kg)  10/13/16 123 lb 12.8 oz (56.2 kg)    Physical Exam Vitals reviewed. Exam conducted with a chaperone present.  Constitutional:      General: She is not in acute distress.    Appearance: Normal appearance. She is overweight. She is not ill-appearing, toxic-appearing or diaphoretic.  HENT:     Head: Normocephalic and atraumatic.     Right Ear: Tympanic membrane, ear canal and external ear normal. There is no impacted cerumen.     Left Ear: Tympanic membrane, ear canal and external ear normal. There is no impacted cerumen.     Nose:  Nose normal. No congestion or rhinorrhea.     Mouth/Throat:     Mouth: Mucous membranes are moist.     Pharynx: Oropharynx is clear. No oropharyngeal exudate or posterior oropharyngeal erythema.  Eyes:     General: No scleral icterus.       Right eye: No discharge.        Left eye: No discharge.     Conjunctiva/sclera: Conjunctivae normal.     Pupils: Pupils are equal, round, and reactive to light.  Cardiovascular:     Rate and Rhythm: Normal rate and regular rhythm.     Heart sounds: Normal heart sounds. No murmur heard. No friction rub. No gallop.   Pulmonary:     Effort: Pulmonary effort is normal. No respiratory distress.     Breath sounds: Normal breath sounds. No stridor. No wheezing, rhonchi or rales.  Abdominal:     General: Abdomen is flat. Bowel sounds are normal. There is no distension.     Palpations: Abdomen is soft. There is no mass.     Tenderness: There is no abdominal tenderness. There is no guarding or rebound.     Hernia: No hernia is present.  Genitourinary:    Pubic Area: No rash or pubic lice.      Labia:        Right: No rash, tenderness, lesion or injury.        Left: No rash, tenderness, lesion or injury.      Vagina: Normal.     Cervix: Normal.     Uterus: Normal.      Adnexa: Right adnexa normal and left adnexa normal.  Musculoskeletal:        General: Normal range of motion.     Cervical back: Normal range of motion and neck supple. No rigidity. No muscular tenderness.  Lymphadenopathy:     Cervical: No cervical adenopathy.  Skin:    General: Skin is warm and dry.     Capillary Refill: Capillary refill takes less than 2 seconds.  Neurological:     General: No focal deficit present.     Mental Status: She is alert and oriented to person, place, and time. Mental status is at baseline.  Psychiatric:        Mood and Affect: Mood normal.        Behavior: Behavior normal.  Thought Content: Thought content normal.        Judgment: Judgment  normal.     Lab Results  Component Value Date   TSH 0.879 08/30/2018   Lab Results  Component Value Date   WBC CANCELED 08/30/2018   HGB CANCELED 08/30/2018   HCT CANCELED 08/30/2018   MCV 82.9 04/08/2016   PLT CANCELED 08/30/2018   Lab Results  Component Value Date   NA 141 08/30/2018   K 4.2 08/30/2018   CO2 20 08/30/2018   GLUCOSE 86 08/30/2018   BUN 11 08/30/2018   CREATININE 0.67 08/30/2018   BILITOT 0.2 08/30/2018   ALKPHOS 73 08/30/2018   AST 16 08/30/2018   ALT 12 08/30/2018   PROT 6.7 08/30/2018   ALBUMIN 4.7 08/30/2018   CALCIUM 9.3 08/30/2018   GFR 139.97 10/13/2016   Lab Results  Component Value Date   CHOL 260 (H) 08/30/2018   Lab Results  Component Value Date   HDL 60 08/30/2018   Lab Results  Component Value Date   LDLCALC 183 (H) 08/30/2018   Lab Results  Component Value Date   TRIG 86 08/30/2018   Lab Results  Component Value Date   CHOLHDL 4.3 08/30/2018   No results found for: HGBA1C

## 2020-03-23 LAB — CMP14+EGFR
ALT: 25 IU/L (ref 0–32)
AST: 20 IU/L (ref 0–40)
Albumin/Globulin Ratio: 1.6 (ref 1.2–2.2)
Albumin: 4.5 g/dL (ref 3.8–4.8)
Alkaline Phosphatase: 87 IU/L (ref 44–121)
BUN/Creatinine Ratio: 22 (ref 12–28)
BUN: 15 mg/dL (ref 8–27)
Bilirubin Total: <0.2 mg/dL (ref 0.0–1.2)
CO2: 21 mmol/L (ref 20–29)
Calcium: 9.6 mg/dL (ref 8.7–10.3)
Chloride: 101 mmol/L (ref 96–106)
Creatinine, Ser: 0.69 mg/dL (ref 0.57–1.00)
GFR calc Af Amer: 109 mL/min/{1.73_m2} (ref >59)
GFR calc non Af Amer: 94 mL/min/{1.73_m2} (ref >59)
Globulin, Total: 2.8 g/dL (ref 1.5–4.5)
Glucose: 87 mg/dL (ref 65–99)
Potassium: 4.9 mmol/L (ref 3.5–5.2)
Sodium: 140 mmol/L (ref 134–144)
Total Protein: 7.3 g/dL (ref 6.0–8.5)

## 2020-03-23 LAB — ANEMIA PROFILE B
Basophils Absolute: 0.1 10*3/uL (ref 0.0–0.2)
Basos: 1 %
EOS (ABSOLUTE): 0 10*3/uL (ref 0.0–0.4)
Eos: 0 %
Ferritin: 193 ng/mL — ABNORMAL HIGH (ref 15–150)
Folate: 11.7 ng/mL (ref >3.0)
Hematocrit: 43.4 % (ref 34.0–46.6)
Hemoglobin: 14.8 g/dL (ref 11.1–15.9)
Immature Grans (Abs): 0 10*3/uL (ref 0.0–0.1)
Immature Granulocytes: 1 %
Iron Saturation: 22 % (ref 15–55)
Iron: 74 ug/dL (ref 27–139)
Lymphocytes Absolute: 2.6 10*3/uL (ref 0.7–3.1)
Lymphs: 32 %
MCH: 29.1 pg (ref 26.6–33.0)
MCHC: 34.1 g/dL (ref 31.5–35.7)
MCV: 85 fL (ref 79–97)
Monocytes Absolute: 0.5 10*3/uL (ref 0.1–0.9)
Monocytes: 6 %
Neutrophils Absolute: 4.9 10*3/uL (ref 1.4–7.0)
Neutrophils: 60 %
Platelets: 259 10*3/uL (ref 150–450)
RBC: 5.08 x10E6/uL (ref 3.77–5.28)
RDW: 14.7 % (ref 11.7–15.4)
Retic Ct Pct: 1.4 % (ref 0.6–2.6)
Total Iron Binding Capacity: 330 ug/dL (ref 250–450)
UIBC: 256 ug/dL (ref 118–369)
Vitamin B-12: 1890 pg/mL — ABNORMAL HIGH (ref 232–1245)
WBC: 8.1 10*3/uL (ref 3.4–10.8)

## 2020-03-23 LAB — VITAMIN D 25 HYDROXY (VIT D DEFICIENCY, FRACTURES): Vit D, 25-Hydroxy: 39 ng/mL (ref 30.0–100.0)

## 2020-03-23 LAB — LIPID PANEL
Chol/HDL Ratio: 4.5 ratio — ABNORMAL HIGH (ref 0.0–4.4)
Cholesterol, Total: 254 mg/dL — ABNORMAL HIGH (ref 100–199)
HDL: 57 mg/dL (ref >39)
LDL Chol Calc (NIH): 180 mg/dL — ABNORMAL HIGH (ref 0–99)
Triglycerides: 98 mg/dL (ref 0–149)
VLDL Cholesterol Cal: 17 mg/dL (ref 5–40)

## 2020-03-23 LAB — TSH: TSH: 1.24 u[IU]/mL (ref 0.450–4.500)

## 2020-03-27 LAB — CYTOLOGY - PAP
Chlamydia: NEGATIVE
Comment: NEGATIVE
Comment: NEGATIVE
Comment: NEGATIVE
Comment: NORMAL
Diagnosis: NEGATIVE
High risk HPV: NEGATIVE
Neisseria Gonorrhea: NEGATIVE
Trichomonas: NEGATIVE

## 2020-04-09 ENCOUNTER — Telehealth: Payer: Self-pay

## 2020-04-09 NOTE — Telephone Encounter (Signed)
Pt informed of all labs. She is aware to continue Atorvastatin and to monitor diet and to increase exercise.

## 2020-05-02 ENCOUNTER — Other Ambulatory Visit: Payer: Self-pay | Admitting: Family Medicine

## 2020-05-02 DIAGNOSIS — Z1231 Encounter for screening mammogram for malignant neoplasm of breast: Secondary | ICD-10-CM

## 2020-05-24 ENCOUNTER — Ambulatory Visit (INDEPENDENT_AMBULATORY_CARE_PROVIDER_SITE_OTHER): Payer: 59 | Admitting: Family Medicine

## 2020-05-24 ENCOUNTER — Encounter: Payer: Self-pay | Admitting: Family Medicine

## 2020-05-24 ENCOUNTER — Other Ambulatory Visit: Payer: Self-pay

## 2020-05-24 VITALS — BP 129/81 | HR 97 | Temp 97.9°F | Ht 68.0 in | Wt 177.6 lb

## 2020-05-24 DIAGNOSIS — K625 Hemorrhage of anus and rectum: Secondary | ICD-10-CM

## 2020-05-24 LAB — HEMOGLOBIN, FINGERSTICK: Hemoglobin: 14.5 g/dL (ref 11.1–15.9)

## 2020-05-24 NOTE — Progress Notes (Signed)
Assessment & Plan:  1. Bright red blood per rectum Discussed bright red blood likely due to hemorrhoids.  Discussed normal bowel movements and trying to prevent constipation.  Hemoglobin within normal limits.  Advised to let us know if symptom worsens. - Hemoglobin, fingerstick   Follow up plan: Return if symptoms worsen or fail to improve.  Hendricks Limes, MSN, APRN, FNP-C [facility]  Subjective:   Patient ID: Monica Elliott, female    DOB: 1960/10/30, 60 y.o.   MRN: [id number]  HPI: Monica Elliott is a 60 y.o. female presenting on 05/24/2020 for Blood In Stools (Patient states it has been going on x 2 weeks on and off. )  Patient reports she has seen bright red blood when she wipes after a bowel movement.  This has been going on for a couple of weeks.  It does not occur with every bowel movement.  She denies any straining.  She is having regular bowel movements 1-2 times per day.  She states sometimes they are hard.  She does have a history of hemorrhoids.   ROS: Negative unless specifically indicated above in HPI.   Relevant past medical history reviewed and updated as indicated.   Allergies and medications reviewed and updated.   Current Outpatient Medications:  .  atorvastatin (LIPITOR) 40 MG tablet, Take 1 tablet (40 mg total) by mouth every evening., Disp: 90 tablet, Rfl: 3 .  BIOTIN PO, Take by mouth., Disp: , Rfl:  .  ferrous sulfate 325 (65 FE) MG tablet, Take 325 mg by mouth daily., Disp: , Rfl: 12 .  lisinopril (ZESTRIL) 20 MG tablet, Take 1 tablet (20 mg total) by mouth daily., Disp: 90 tablet, Rfl: 3 .  meloxicam (MOBIC) 15 MG tablet, Take 1 tablet by mouth daily., Disp: , Rfl:  .  Multiple Vitamin (MULTIVITAMIN) capsule, Take 1 capsule by mouth daily., Disp: , Rfl:  .  predniSONE (STERAPRED UNI-PAK 21 TAB) 5 MG (21) TBPK tablet, Take by mouth., Disp: , Rfl:  .  Turmeric (QC TUMERIC COMPLEX PO), Take 1 capsule by mouth daily., Disp: , Rfl:   .  VITAMIN D PO, Take 1,000 Units by mouth daily., Disp: , Rfl:   No Known Allergies  Objective:   BP 129/81   Pulse 97   Temp 97.9 F (36.6 C) (Temporal)   Ht 5\' 8"  (1.727 m)   Wt 177 lb 9.6 oz (80.6 kg)   LMP 05/10/2011   SpO2 98%   BMI 27.00 kg/m    Physical Exam Vitals reviewed. Exam conducted with a chaperone present.  Constitutional:      General: She is not in acute distress.    Appearance: Normal appearance. She is not ill-appearing, toxic-appearing or diaphoretic.  HENT:     Head: Normocephalic and atraumatic.  Eyes:     General: No scleral icterus.       Right eye: No discharge.        Left eye: No discharge.     Conjunctiva/sclera: Conjunctivae normal.  Cardiovascular:     Rate and Rhythm: Normal rate.  Pulmonary:     Effort: Pulmonary effort is normal. No respiratory distress.  Genitourinary:    Rectum: External hemorrhoid present.  Musculoskeletal:        General: Normal range of motion.     Cervical back: Normal range of motion.  Skin:    General: Skin is warm and dry.     Capillary Refill: Capillary refill takes less than  2 seconds.  Neurological:     General: No focal deficit present.     Mental Status: She is alert and oriented to person, place, and time. Mental status is at baseline.  Psychiatric:        Mood and Affect: Mood normal.        Behavior: Behavior normal.        Thought Content: Thought content normal.        Judgment: Judgment normal.

## 2020-05-29 ENCOUNTER — Other Ambulatory Visit: Payer: Self-pay

## 2020-05-29 ENCOUNTER — Ambulatory Visit: Payer: 59 | Attending: Family Medicine | Admitting: Physical Therapy

## 2020-05-29 ENCOUNTER — Encounter: Payer: Self-pay | Admitting: Physical Therapy

## 2020-05-29 ENCOUNTER — Ambulatory Visit
Admission: RE | Admit: 2020-05-29 | Discharge: 2020-05-29 | Disposition: A | Discharge status: Home / Self Care | Payer: 59 | Source: Ambulatory Visit | Attending: Family Medicine | Admitting: Family Medicine

## 2020-05-29 DIAGNOSIS — M5416 Radiculopathy, lumbar region: Secondary | ICD-10-CM | POA: Diagnosis present

## 2020-05-29 DIAGNOSIS — Z1231 Encounter for screening mammogram for malignant neoplasm of breast: Secondary | ICD-10-CM

## 2020-05-29 NOTE — Patient Instructions (Signed)
Access Code: [email or web address] URL: [email or web address] Date: 05/29/2020 Prepared by: Almyra Free  Exercises Prone Press Up - 3-4 x daily - 7 x weekly - 1-3 sets - 10 reps Seated Piriformis Stretch with Trunk Bend - 2 x daily - 7 x weekly - 1 sets - 3 reps - 30-60 sec hold Standing Lumbar Extension at [place] - 3-4 x daily - 7 x weekly - 1-3 sets - 10 reps

## 2020-05-29 NOTE — Therapy (Signed)
[facility] [place], [place], [address] Phone: [phone]   Fax:  [phone]  Physical Therapy Evaluation  Patient Details  Name: Monica Elliott MRN: [id number] Date of Birth: 08-21-1960 Referring Provider (PT): Rhina Brackett MD   Encounter Date: 05/29/2020   PT End of Session - 05/29/20 0951    Visit Number 1    Date for PT Re-Evaluation 07/10/20    Authorization Type [employer]    PT Start Time [phone]    PT Stop Time 1029    PT Time Calculation (min) 38 min    Activity Tolerance Patient tolerated treatment well    Behavior During Therapy [facility] for tasks assessed/performed           Past Medical History:  Diagnosis Date  . Anemia    states low iron  . Depression   . Hypercholesterolemia   . Hypertension   . Vitamin D deficiency     Past Surgical History:  Procedure Laterality Date  . BACK SURGERY  2016  . BREAST BIOPSY    . COLONOSCOPY  04/06/2011   Colonic diverticulosis and colonic polyps-removed as described above. Polyps benign. Next colonoscopy 04/2021.  . TUBAL LIGATION      There were no vitals filed for this visit.    Subjective Assessment - 05/29/20 0954    Subjective Just finished 6 day steroid with some relief. 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 Patient feeling pain in the low back and runs down right leg to her toes. She also has some N/T. She reports difficulty with lifting fry basket and dumping in container.    Pertinent History HTN, anemia, back surgery 2015    How long can you sit comfortably? 30-45 min    How long can you stand comfortably? 30-45 min    How long can you walk comfortably? 30-45 min    Diagnostic tests MRI: disc bulge at L5/S1 right    Patient Stated Goals to get rid of pain    Currently in Pain? Yes    Pain Score 6     Pain Location Back    Pain Orientation Right    Pain Descriptors / Indicators  Sharp;Throbbing    Pain Type Chronic pain    Pain Radiating Towards to toes    Pain Onset More than a month ago    Pain Frequency Constant    Pain Relieving Factors uses back brace at work    Effect of Pain on Daily Activities just pushes through              [facility] PT Assessment - 05/29/20 0001      Assessment   Medical Diagnosis low back pain with possible radiculopathy; right greater trochanteric bursitis    Referring Provider (PT) Rhina Brackett MD    Onset Date/Surgical Date 02/03/20    Hand Dominance Right    Next MD Visit 3 weeks    Prior Therapy yes after back surgery      Precautions   Precaution Comments lumbar bulging disc L5/S1      Restrictions   Weight Bearing Restrictions No      Balance Screen   Has the patient fallen in the past 6 months No    Has the patient had a decrease in activity level because of a fear of falling?  No    Is the patient reluctant to leave their home because of  a fear of falling?  No      Home Ecologist residence    Additional Comments no stairs      Prior Function   Level of Independence Independent    Vocation Full time employment    Vocation Requirements [place] walks but limited; playing with grand kids      Posture/Postural Control   Posture Comments stands with weight through forefeet, mild trunk fleixon      ROM / Strength   AROM / PROM / Strength AROM;Strength      AROM   Overall AROM Comments lumbar WFL some pain with end range rotation bil      Strength   Overall Strength Comments grossly 5/5 except bil hip ABD 4/5; core weakness wtih MMT of hip flexors      Flexibility   Soft Tissue Assessment /Muscle Length yes    Hamstrings bil tightness; R +SLR    Quadriceps WNL    ITB + ober right    Piriformis bil tightness    Quadratus Lumborum bil lumbar tight and sore      Palpation   Spinal mobility some decrease in mobility right due to tight muscles and mild  pain at L4/5, L5/S1 with UPA mobs    Palpation comment marked tenderness bil gluteals, piriformis      Special Tests   Other special tests + SLR on right, + ober right                      Objective measurements completed on examination: See above findings.                    PT Long Term Goals - 05/29/20 1459      PT LONG TERM GOAL #1   Title I with HEP with functional strengthening for low/mid and upper back, core strength and LE flexibility    Time 6    Period Weeks    Status New    Target Date 07/10/20      PT LONG TERM GOAL #2   Title Pt to report decreased radicular sx into R LE by 75% or more with ADLS.    Time 6    Period Weeks    Status New      PT LONG TERM GOAL #3   Title demo correct body mechanics for performing lifting tasks (with fries) at work and be ind in upper back HEP strength to assist this.    Time 6    Period Weeks    Status New      PT LONG TERM GOAL #4   Title Patient able to sleep on right side without waking from pain.    Time 6    Period Weeks    Status New      PT LONG TERM GOAL #5   Title Improved bil hip ABD to 4+/5 or better in order to perform functional activites.    Time 6    Period Weeks    Status New                  Plan - 05/29/20 1029    Clinical Impression Statement Patient presents to therapy with reports of right low back and leg pain for over one year affecting ADLs including chores, standing and walking. She also is unable to sleep on her right side. She has trigger points and pain  with palpation to right gluteals and piriformis and + ober on right.  She reports intermitent N/T as well. She has functional lumbar ROM but pain at end range flexion and rotation She has mild strength deficits in her LEs and demonstrates core weakness with MMT. She is able to centralize with press ups. 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 She will benefit from skilled PT to address these deficits.    Personal Factors and Comorbidities Comorbidity 3+;Time since onset of injury/illness/exacerbation    Comorbidities HTN, anemia, back surgery 2015    Examination-Activity Limitations Carry;Lift;Sleep;Stand;Locomotion Level    Stability/Clinical Decision Making Stable/Uncomplicated    Clinical Decision Making Low    Rehab Potential Excellent    PT Frequency 2x / week    PT Duration 6 weeks    PT Treatment/Interventions ADLs/Self Care Home Management;Cryotherapy;Electrical Stimulation;Moist Heat;Traction;Neuromuscular re-education;Therapeutic exercise;Therapeutic activities;Patient/family education;Manual techniques;Dry needling    PT Next Visit Plan assess prone press ups and progress extension biased TE; ITB, piriformis mobility; planks/side planks, dead lifts, lawn mowers and core with reach outs for upper body strength    PT Home Exercise Plan [email or web address]    Consulted and Agree with Plan of Care Patient           Patient will benefit from skilled therapeutic intervention in order to improve the following deficits and impairments:  Pain,Increased muscle spasms,Decreased activity tolerance,Impaired flexibility,Postural dysfunction,Decreased strength  Visit Diagnosis: Radiculopathy, lumbar region - Plan: PT plan of care cert/re-cert     Problem List Patient Active Problem List   Diagnosis Date Noted  . Tobacco use 03/22/2020  . Vitamin D deficiency 04/08/2016  . Essential hypertension 04/08/2016  . Hyperlipidemia 04/08/2016  . Iron deficiency anemia 04/08/2016  . Anxiety disorder 02/06/2015  . Major depressive disorder, single episode 02/06/2015   Madelyn Flavors PT 05/29/2020, 3:10 PM  [facility] [address] [place], [place], [address] Phone: [phone]   Fax:  [phone]  Name: Keyah Blizard Mates MRN: [id number] Date of Birth: [birth year]

## 2020-05-31 ENCOUNTER — Ambulatory Visit: Payer: 59 | Admitting: Physical Therapy

## 2020-05-31 ENCOUNTER — Encounter: Payer: Self-pay | Admitting: Physical Therapy

## 2020-05-31 ENCOUNTER — Other Ambulatory Visit: Payer: Self-pay

## 2020-05-31 DIAGNOSIS — M5416 Radiculopathy, lumbar region: Secondary | ICD-10-CM | POA: Diagnosis not present

## 2020-05-31 NOTE — Therapy (Signed)
[facility] [place], [place], [address] Phone: [phone]   Fax:  [phone]  Physical Therapy Treatment  Patient Details  Name: Monica Elliott MRN: [id number] Date of Birth: 1960-10-22 Referring Provider (PT): Rhina Brackett MD   Encounter Date: 05/31/2020   PT End of Session - 05/31/20 0902    Visit Number 2    Date for PT Re-Evaluation 07/10/20    Authorization Type [employer]    PT Start Time 0901    PT Stop Time 0945    PT Time Calculation (min) 44 min    Activity Tolerance Patient tolerated treatment well    Behavior During Therapy [facility] for tasks assessed/performed           Past Medical History:  Diagnosis Date  . Anemia    states low iron  . Depression   . Hypercholesterolemia   . Hypertension   . Vitamin D deficiency     Past Surgical History:  Procedure Laterality Date  . BACK SURGERY  2016  . BREAST BIOPSY    . COLONOSCOPY  04/06/2011   Colonic diverticulosis and colonic polyps-removed as described above. Polyps benign. Next colonoscopy 04/2021.  . TUBAL LIGATION      There were no vitals filed for this visit.   Subjective Assessment - 05/31/20 0900    Subjective Reports that back feels fair and still having pain in LEs. Pain is still going to toes.    Pertinent History HTN, anemia, back surgery 2015    How long can you sit comfortably? 30-45 min    How long can you stand comfortably? 30-45 min    How long can you walk comfortably? 30-45 min    Diagnostic tests MRI: disc bulge at L5/S1 right    Patient Stated Goals to get rid of pain    Currently in Pain? Yes    Pain Score 5     Pain Location Back    Pain Orientation Right;Lower    Pain Descriptors / Indicators Discomfort    Pain Type Chronic pain    Pain Radiating Towards R toe, L posterior thigh    Pain Onset More than a month ago    Pain Frequency Constant              [facility] PT Assessment - 05/31/20 0001      Assessment    Medical Diagnosis low back pain with possible radiculopathy; right greater trochanteric bursitis    Referring Provider (PT) Rhina Brackett MD    Onset Date/Surgical Date 02/03/20    Hand Dominance Right    Next MD Visit 3 weeks    Prior Therapy yes after back surgery      Precautions   Precaution Comments lumbar bulging disc L5/S1      Restrictions   Weight Bearing Restrictions No                         [facility] Adult PT Treatment/Exercise - 05/31/20 0001      Exercises   Exercises Lumbar      Lumbar Exercises: Stretches   Passive Hamstring Stretch Right;Left;3 reps;30 seconds    Single Knee to Chest Stretch Right;Left;3 reps;30 seconds    Lower Trunk Rotation 5 reps;10 seconds    Figure 4 Stretch 3 reps;30 seconds;Seated;Without overpressure    Other Lumbar Stretch Exercise Rockerboard x3 min      Lumbar Exercises: Aerobic   Recumbent Bike L4 x10 min  Lumbar Exercises: Seated   Sit to Stand 15 reps    Sit to Stand Limitations posture focus; min UE support required      Lumbar Exercises: Supine   Bridge 15 reps;3 seconds    Other Supine Lumbar Exercises Ab bracing x10 reps 5 sec holds      Lumbar Exercises: Sidelying   Clam Both;20 reps                       PT Long Term Goals - 05/29/20 1459      PT LONG TERM GOAL #1   Title I with HEP with functional strengthening for low/mid and upper back, core strength and LE flexibility    Time 6    Period Weeks    Status New    Target Date 07/10/20      PT LONG TERM GOAL #2   Title Pt to report decreased radicular sx into R LE by 75% or more with ADLS.    Time 6    Period Weeks    Status New      PT LONG TERM GOAL #3   Title demo correct body mechanics for performing lifting tasks (with fries) at work and be ind in upper back HEP strength to assist this.    Time 6    Period Weeks    Status New      PT LONG TERM GOAL #4   Title Patient able to sleep on right side without waking  from pain.    Time 6    Period Weeks    Status New      PT LONG TERM GOAL #5   Title Improved bil hip ABD to 4+/5 or better in order to perform functional activites.    Time 6    Period Weeks    Status New                 Plan - 05/31/20 1027    Clinical Impression Statement Patient presented in clinic with reports of BLE radicular pain RLE greater than LLE. Patient guided through LE stretching in which greater response noted more in RLE. Patient also educated to improve lumbar support in chairs if needed and also educated throughout treatment regarding posture, abdominal bracing technique. Patient reports compliance with HEP prior to work yesterday and states that she felt better at work after stretching. No worsening symptoms reported by patient during therex but patient educated to monitor symptoms until next appointment. Compliance with log rolling noted during treatment as well.    Personal Factors and Comorbidities Comorbidity 3+;Time since onset of injury/illness/exacerbation    Comorbidities HTN, anemia, back surgery 2015    Examination-Activity Limitations Carry;Lift;Sleep;Stand;Locomotion Level    Stability/Clinical Decision Making Stable/Uncomplicated    Rehab Potential Excellent    PT Frequency 2x / week    PT Duration 6 weeks    PT Treatment/Interventions ADLs/Self Care Home Management;Cryotherapy;Electrical Stimulation;Moist Heat;Traction;Neuromuscular re-education;Therapeutic exercise;Therapeutic activities;Patient/family education;Manual techniques;Dry needling    PT Next Visit Plan assess prone press ups and progress extension biased TE; ITB, piriformis mobility; planks/side planks, dead lifts, lawn mowers and core with reach outs for upper body strength    PT Home Exercise Plan [email or web address]    Consulted and Agree with Plan of Care Patient           Patient will benefit from skilled therapeutic intervention in order to improve the following deficits and impairments:   Pain,Increased muscle spasms,Decreased activity tolerance,Impaired flexibility,Postural dysfunction,Decreased  strength  Visit Diagnosis: Radiculopathy, lumbar region     Problem List Patient Active Problem List   Diagnosis Date Noted  . Tobacco use 03/22/2020  . Vitamin D deficiency 04/08/2016  . Essential hypertension 04/08/2016  . Hyperlipidemia 04/08/2016  . Iron deficiency anemia 04/08/2016  . Anxiety disorder 02/06/2015  . Major depressive disorder, single episode 02/06/2015    Standley Brooking, PTA 05/31/2020, 10:33 AM  [facility] [place], [place], [address] Phone: [phone]   Fax:  [phone]  Name: Monica Elliott MRN: [id number] Date of Birth: [birth year]

## 2020-06-03 ENCOUNTER — Other Ambulatory Visit: Payer: Self-pay

## 2020-06-03 ENCOUNTER — Ambulatory Visit: Payer: 59 | Attending: Family Medicine | Admitting: Physical Therapy

## 2020-06-03 DIAGNOSIS — M5416 Radiculopathy, lumbar region: Secondary | ICD-10-CM | POA: Diagnosis present

## 2020-06-03 NOTE — Therapy (Signed)
[facility] [place], [place], [address] Phone: [phone]   Fax:  [phone]  Physical Therapy Treatment  Patient Details  Name: Monica Elliott MRN: [id number] Date of Birth: [birth year] Referring Provider (PT): Rhina Brackett MD   Encounter Date: 06/03/2020   PT End of Session - 06/03/20 0925    Visit Number 3    Date for PT Re-Evaluation 07/10/20    Authorization Type [employer]    PT Start Time 0900    PT Stop Time 0943    PT Time Calculation (min) 43 min    Activity Tolerance Patient tolerated treatment well    Behavior During Therapy [facility] for tasks assessed/performed           Past Medical History:  Diagnosis Date  . Anemia    states low iron  . Depression   . Hypercholesterolemia   . Hypertension   . Vitamin D deficiency     Past Surgical History:  Procedure Laterality Date  . BACK SURGERY  2016  . BREAST BIOPSY    . COLONOSCOPY  04/06/2011   Colonic diverticulosis and colonic polyps-removed as described above. Polyps benign. Next colonoscopy 04/2021.  . TUBAL LIGATION      There were no vitals filed for this visit.   Subjective Assessment - 06/03/20 0902    Subjective COVID 19 screening performed upon arrival. Patient reported doing good today with some ongoing discomfort.    Pertinent History HTN, anemia, back surgery 2015    How long can you sit comfortably? 30-45 min    How long can you stand comfortably? 30-45 min    How long can you walk comfortably? 30-45 min    Diagnostic tests MRI: disc bulge at L5/S1 right    Currently in Pain? Yes    Pain Score 5     Pain Location Back    Pain Orientation Lower;Right    Pain Descriptors / Indicators Discomfort    Pain Type Chronic pain    Pain Onset More than a month ago    Pain Frequency Constant    Aggravating Factors  increased activity    Pain Relieving Factors rest                             [facility] Adult PT  Treatment/Exercise - 06/03/20 0001      Lumbar Exercises: Stretches   Single Knee to Chest Stretch Right;Left;3 reps;30 seconds    Lower Trunk Rotation 5 reps;10 seconds    Piriformis Stretch Right;Left;3 reps;20 seconds    Other Lumbar Stretch Exercise Rockerboard x3 min    Other Lumbar Stretch Exercise standing hip flexor stretch 30sec x3 each LE      Lumbar Exercises: Aerobic   Recumbent Bike L4 x10 min UE/LEactivity      Lumbar Exercises: Standing   Row Strengthening;Both;20 reps;Theraband    Theraband Level (Row) Other (comment)    Row Limitations blue XTS    Shoulder Extension Strengthening;Both;Theraband   2x10   Theraband Level (Shoulder Extension) Other (comment)    Shoulder Extension Limitations Blue XTS    Other Standing Lumbar Exercises Bil hip abd x20 each LE      Lumbar Exercises: Seated   Other Seated Lumbar Exercises abdominal bracing 10sec x10 reps with red swiss      Lumbar Exercises: Supine   Bent Knee Raise 2 seconds   2x10   Bridge 15 reps;3 seconds  Straight Leg Raise 3 seconds   2x10     Lumbar Exercises: Sidelying   Clam Both;20 reps    Hip Abduction Both;15 reps                       PT Long Term Goals - 06/03/20 0905      PT LONG TERM GOAL #1   Title I with HEP with functional strengthening for low/mid and upper back, core strength and LE flexibility    Time 6    Period Weeks    Status On-going      PT LONG TERM GOAL #2   Title Pt to report decreased radicular sx into R LE by 75% or more with ADLS.    Baseline ongoing LE symptoms 06/03/20    Time 6    Period Weeks    Status On-going      PT LONG TERM GOAL #3   Title demo correct body mechanics for performing lifting tasks (with fries) at work and be ind in upper back HEP strength to assist this.    Time 6    Period Weeks    Status On-going      PT LONG TERM GOAL #4   Title Patient able to sleep on right side without waking from pain.    Baseline able to sleep on right  some this weekend 06/03/20    Time 6    Period Weeks    Status Partially Met      PT LONG TERM GOAL #5   Title Improved bil hip ABD to 4+/5 or better in order to perform functional activites.    Time 6    Period Weeks    Status On-going                 Plan - 06/03/20 9163    Clinical Impression Statement Patient tolerated treatment well today. patient able to progress with abdominal bracing progression today. Patient has reported being able to sleep on right side some this past weekend yet continues to have LE symptoms. Patient doing well with initail HEP activities. patient current goals ongoing due to pain limitations.    Personal Factors and Comorbidities Comorbidity 3+;Time since onset of injury/illness/exacerbation    Comorbidities HTN, anemia, back surgery 2015    Examination-Activity Limitations Carry;Lift;Sleep;Stand;Locomotion Level    Stability/Clinical Decision Making Stable/Uncomplicated    Rehab Potential Excellent    PT Frequency 2x / week    PT Duration 6 weeks    PT Treatment/Interventions ADLs/Self Care Home Management;Cryotherapy;Electrical Stimulation;Moist Heat;Traction;Neuromuscular re-education;Therapeutic exercise;Therapeutic activities;Patient/family education;Manual techniques;Dry needling    PT Next Visit Plan cont with POC for prone press ups and progress extension biased TE; ITB, piriformis mobility; planks/side planks, dead lifts, lawn mowers and core with reach outs for upper body strength    Consulted and Agree with Plan of Care Patient           Patient will benefit from skilled therapeutic intervention in order to improve the following deficits and impairments:  Pain,Increased muscle spasms,Decreased activity tolerance,Impaired flexibility,Postural dysfunction,Decreased strength  Visit Diagnosis: Radiculopathy, lumbar region     Problem List Patient Active Problem List   Diagnosis Date Noted  . Tobacco use 03/22/2020  . Vitamin D  deficiency 04/08/2016  . Essential hypertension 04/08/2016  . Hyperlipidemia 04/08/2016  . Iron deficiency anemia 04/08/2016  . Anxiety disorder 02/06/2015  . Major depressive disorder, single episode 02/06/2015    Asti Mackley P, PTA 06/03/2020, 9:47  [facility] [place], [place], [address] Phone: [phone]   Fax:  [phone]  Name: Jemiah Cuadra Passarella MRN: [id number] Date of Birth: 24-Nov-1960

## 2020-06-05 ENCOUNTER — Ambulatory Visit: Payer: 59 | Admitting: *Deleted

## 2020-06-05 ENCOUNTER — Other Ambulatory Visit: Payer: Self-pay

## 2020-06-05 DIAGNOSIS — M5416 Radiculopathy, lumbar region: Secondary | ICD-10-CM

## 2020-06-05 NOTE — Therapy (Signed)
[facility] [place], [place], [address] Phone: [phone]   Fax:  [phone]  Physical Therapy Treatment  Patient Details  Name: Monica Elliott MRN: [id number] Date of Birth: 02-11-1960 Referring Provider (PT): Rhina Brackett MD   Encounter Date: 06/05/2020   PT End of Session - 06/05/20 1310    Visit Number 4    Date for PT Re-Evaluation 07/10/20    Authorization Type [employer]    PT Start Time 0945    PT Stop Time 1034    PT Time Calculation (min) 49 min           Past Medical History:  Diagnosis Date  . Anemia    states low iron  . Depression   . Hypercholesterolemia   . Hypertension   . Vitamin D deficiency     Past Surgical History:  Procedure Laterality Date  . BACK SURGERY  2016  . BREAST BIOPSY    . COLONOSCOPY  04/06/2011   Colonic diverticulosis and colonic polyps-removed as described above. Polyps benign. Next colonoscopy 04/2021.  . TUBAL LIGATION      There were no vitals filed for this visit.   Subjective Assessment - 06/05/20 0946    Subjective COVID 19 screening performed upon arrival. 5-6/10  RT LE into Big toe                             [facility] Adult PT Treatment/Exercise - 06/05/20 0001      Bed Mobility   Bed Mobility Rolling Right;Rolling Left;Supine to Sit;Sit to Supine      Posture/Postural Control   Posture Comments sitting postures, sleeping postures, Log roll,      Exercises   Exercises Lumbar      Lumbar Exercises: Aerobic   Tread Mill x 10 1.7 MPH mins with focus on posture      Lumbar Exercises: Seated   Sit to Stand 15 reps   chair squat     Lumbar Exercises: Supine   Other Supine Lumbar Exercises Ab bracing x10 reps 5 sec holds    Other Supine Lumbar Exercises Reviewed HEP      Lumbar Exercises: Quadruped   Madcat/Old Horse 10 reps    Madcat/Old Horse Limitations Challenging for Pt    Single Arm Raise Right;Left;5 reps;5 seconds     Single Arm Raises Limitations Mild increase in LBP                       PT Long Term Goals - 06/03/20 0905      PT LONG TERM GOAL #1   Title I with HEP with functional strengthening for low/mid and upper back, core strength and LE flexibility    Time 6    Period Weeks    Status On-going      PT LONG TERM GOAL #2   Title Pt to report decreased radicular sx into R LE by 75% or more with ADLS.    Baseline ongoing LE symptoms 06/03/20    Time 6    Period Weeks    Status On-going      PT LONG TERM GOAL #3   Title demo correct body mechanics for performing lifting tasks (with fries) at work and be ind in upper back HEP strength to assist this.    Time 6    Period Weeks    Status On-going  PT LONG TERM GOAL #4   Title Patient able to sleep on right side without waking from pain.    Baseline able to sleep on right some this weekend 06/03/20    Time 6    Period Weeks    Status Partially Met      PT LONG TERM GOAL #5   Title Improved bil hip ABD to 4+/5 or better in order to perform functional activites.    Time 6    Period Weeks    Status On-going                 Plan - 06/05/20 1312    Clinical Impression Statement Pt arrived today doing a little better with decreased pain. Rx focused on neutral lumbar position with AB bracing and maintaining it during transitional movements. Chair squat was also practiced with emphesis on hinge bending and was challenging for Pt.    Personal Factors and Comorbidities Comorbidity 3+;Time since onset of injury/illness/exacerbation    Comorbidities HTN, anemia, back surgery 2015    Stability/Clinical Decision Making Stable/Uncomplicated    Rehab Potential Excellent    PT Frequency 2x / week    PT Duration 6 weeks    PT Treatment/Interventions ADLs/Self Care Home Management;Cryotherapy;Electrical Stimulation;Moist Heat;Traction;Neuromuscular re-education;Therapeutic exercise;Therapeutic activities;Patient/family  education;Manual techniques;Dry needling    PT Next Visit Plan cont with POC for prone press ups and progress extension biased TE; ITB, piriformis mobility; planks/side planks, dead lifts, lawn mowers and core with reach outs for upper body strength    Consulted and Agree with Plan of Care Patient           Patient will benefit from skilled therapeutic intervention in order to improve the following deficits and impairments:  Pain,Increased muscle spasms,Decreased activity tolerance,Impaired flexibility,Postural dysfunction,Decreased strength  Visit Diagnosis: Radiculopathy, lumbar region     Problem List Patient Active Problem List   Diagnosis Date Noted  . Tobacco use 03/22/2020  . Vitamin D deficiency 04/08/2016  . Essential hypertension 04/08/2016  . Hyperlipidemia 04/08/2016  . Iron deficiency anemia 04/08/2016  . Anxiety disorder 02/06/2015  . Major depressive disorder, single episode 02/06/2015    Roselyne Stalnaker,CHRIS, PTA 06/05/2020, 1:19 PM  [facility] [address] [place], [place], [address] Phone: [phone]   Fax:  [phone]  Name: Shaquna Geigle Bulkley MRN: [id number] Date of Birth: 02/17/1960

## 2020-06-10 ENCOUNTER — Other Ambulatory Visit: Payer: Self-pay

## 2020-06-10 ENCOUNTER — Encounter: Payer: Self-pay | Admitting: Physical Therapy

## 2020-06-10 ENCOUNTER — Ambulatory Visit: Payer: 59 | Admitting: Physical Therapy

## 2020-06-10 DIAGNOSIS — M5416 Radiculopathy, lumbar region: Secondary | ICD-10-CM | POA: Diagnosis not present

## 2020-06-10 NOTE — Therapy (Signed)
[facility] [place], [place], [address] Phone: [phone]   Fax:  [phone]  Physical Therapy Treatment  Patient Details  Name: Monica Elliott MRN: [id number] Date of Birth: 26-Sep-1960 Referring Provider (PT): Rhina Brackett MD   Encounter Date: 06/10/2020   PT End of Session - 06/10/20 0744    Visit Number 5    Date for PT Re-Evaluation 07/10/20    Authorization Type [employer]    PT Start Time 0731    PT Stop Time 0814    PT Time Calculation (min) 43 min    Activity Tolerance Patient tolerated treatment well    Behavior During Therapy [facility] for tasks assessed/performed           Past Medical History:  Diagnosis Date  . Anemia    states low iron  . Depression   . Hypercholesterolemia   . Hypertension   . Vitamin D deficiency     Past Surgical History:  Procedure Laterality Date  . BACK SURGERY  2016  . BREAST BIOPSY    . COLONOSCOPY  04/06/2011   Colonic diverticulosis and colonic polyps-removed as described above. Polyps benign. Next colonoscopy 04/2021.  . TUBAL LIGATION      There were no vitals filed for this visit.   Subjective Assessment - 06/10/20 0733    Subjective COVID 19 screening performed upon arrival. Still having numbness in her toes but pain is now primarily in low back. Less LLE symptoms    Pertinent History HTN, anemia, back surgery 2015    How long can you sit comfortably? 30-45 min    How long can you stand comfortably? 30-45 min    How long can you walk comfortably? 30-45 min    Diagnostic tests MRI: disc bulge at L5/S1 right    Patient Stated Goals to get rid of pain    Currently in Pain? Yes    Pain Score 5     Pain Location Back    Pain Orientation Right;Lower    Pain Descriptors / Indicators Discomfort    Pain Type Chronic pain    Pain Onset More than a month ago    Pain Frequency Constant              [facility] PT Assessment - 06/10/20 0001      Assessment    Medical Diagnosis low back pain with possible radiculopathy; right greater trochanteric bursitis    Referring Provider (PT) Rhina Brackett MD    Onset Date/Surgical Date 02/03/20    Hand Dominance Right    Next MD Visit 3 weeks    Prior Therapy yes after back surgery      Precautions   Precaution Comments lumbar bulging disc L5/S1      Restrictions   Weight Bearing Restrictions No                         [facility] Adult PT Treatment/Exercise - 06/10/20 0001      Lumbar Exercises: Stretches   Active Hamstring Stretch Right;3 reps;20 seconds    Single Knee to Chest Stretch Right;3 reps;20 seconds    Standing Side Bend Left;5 reps;10 seconds    Standing Extension 10 reps;5 seconds    Figure 4 Stretch 3 reps;20 seconds;Seated;With overpressure;Limitations    Figure 4 Stretch Limitations BLE in sitting      Lumbar Exercises: Aerobic   Nustep L4 x10 min      Lumbar Exercises: Standing  Functional Squats 20 reps;2 seconds    Shoulder Extension Strengthening;Both;20 reps;Limitations    Shoulder Extension Limitations Blue XTS    Other Standing Lumbar Exercises B hip abd, ext with bracing x15 reps      Lumbar Exercises: Supine   Bent Knee Raise 20 reps;2 seconds    Bridge 20 reps;2 seconds      Lumbar Exercises: Sidelying   Clam Both;20 reps      Lumbar Exercises: Prone   Single Arm Raise Right;Left;Limitations    Single Arm Raises Limitations limited to two reps; more difficult and minimal pain    Straight Leg Raise 15 reps;2 seconds                       PT Long Term Goals - 06/03/20 0905      PT LONG TERM GOAL #1   Title I with HEP with functional strengthening for low/mid and upper back, core strength and LE flexibility    Time 6    Period Weeks    Status On-going      PT LONG TERM GOAL #2   Title Pt to report decreased radicular sx into R LE by 75% or more with ADLS.    Baseline ongoing LE symptoms 06/03/20    Time 6    Period Weeks     Status On-going      PT LONG TERM GOAL #3   Title demo correct body mechanics for performing lifting tasks (with fries) at work and be ind in upper back HEP strength to assist this.    Time 6    Period Weeks    Status On-going      PT LONG TERM GOAL #4   Title Patient able to sleep on right side without waking from pain.    Baseline able to sleep on right some this weekend 06/03/20    Time 6    Period Weeks    Status Partially Met      PT LONG TERM GOAL #5   Title Improved bil hip ABD to 4+/5 or better in order to perform functional activites.    Time 6    Period Weeks    Status On-going                 Plan - 06/10/20 7564    Clinical Impression Statement Patient presented in clinic with reports of moderate R LBP but is compliant with HEP stretching. Patient reports sleeping better on her back and R side now. Abdominal bracing instructed throughout therex today with focus on core stability. Patient experienced more difficulty with prone exercises especially arm raise. Fairly good lumbar stability noted with prone exercises over one pillow. No increased symptoms after standing lumbar extension.    Personal Factors and Comorbidities Comorbidity 3+;Time since onset of injury/illness/exacerbation    Comorbidities HTN, anemia, back surgery 2015    Examination-Activity Limitations Carry;Lift;Sleep;Stand;Locomotion Level    Stability/Clinical Decision Making Stable/Uncomplicated    Rehab Potential Excellent    PT Frequency 2x / week    PT Duration 6 weeks    PT Treatment/Interventions ADLs/Self Care Home Management;Cryotherapy;Electrical Stimulation;Moist Heat;Traction;Neuromuscular re-education;Therapeutic exercise;Therapeutic activities;Patient/family education;Manual techniques;Dry needling    PT Next Visit Plan cont with POC for prone press ups and progress extension biased TE; ITB, piriformis mobility; planks/side planks, dead lifts, lawn mowers and core with reach outs for upper  body strength    PT Home Exercise Plan [id number]    Consulted and Agree with  Plan of Care Patient           Patient will benefit from skilled therapeutic intervention in order to improve the following deficits and impairments:  Pain,Increased muscle spasms,Decreased activity tolerance,Impaired flexibility,Postural dysfunction,Decreased strength  Visit Diagnosis: Radiculopathy, lumbar region     Problem List Patient Active Problem List   Diagnosis Date Noted  . Tobacco use 03/22/2020  . Vitamin D deficiency 04/08/2016  . Essential hypertension 04/08/2016  . Hyperlipidemia 04/08/2016  . Iron deficiency anemia 04/08/2016  . Anxiety disorder 02/06/2015  . Major depressive disorder, single episode 02/06/2015    Standley Brooking, PTA 06/10/2020, 8:19 AM  [facility] [address] [place], [place], [address] Phone: [phone]   Fax:  [phone]  Name: Monica Elliott MRN: [id number] Date of Birth: 1960/09/09

## 2020-06-12 ENCOUNTER — Ambulatory Visit: Payer: 59 | Admitting: Physical Therapy

## 2020-06-17 ENCOUNTER — Ambulatory Visit: Payer: 59 | Admitting: Physical Therapy

## 2020-06-17 ENCOUNTER — Other Ambulatory Visit: Payer: Self-pay

## 2020-06-17 DIAGNOSIS — M5416 Radiculopathy, lumbar region: Secondary | ICD-10-CM | POA: Diagnosis not present

## 2020-06-17 NOTE — Therapy (Signed)
[facility] [place], [place], [address] Phone: [phone]   Fax:  [phone]  Physical Therapy Treatment  Patient Details  Name: Monica Elliott Parmer MRN: [id number] Date of Birth: [birth year] Referring Provider (PT): Rhina Brackett MD   Encounter Date: 06/17/2020   PT End of Session - 06/17/20 0904    Visit Number 6    Date for PT Re-Evaluation 07/10/20    Authorization Type [employer]    PT Start Time 0900    PT Stop Time 0944    PT Time Calculation (min) 44 min    Activity Tolerance Patient tolerated treatment well    Behavior During Therapy [facility] for tasks assessed/performed           Past Medical History:  Diagnosis Date  . Anemia    states low iron  . Depression   . Hypercholesterolemia   . Hypertension   . Vitamin D deficiency     Past Surgical History:  Procedure Laterality Date  . BACK SURGERY  2016  . BREAST BIOPSY    . COLONOSCOPY  04/06/2011   Colonic diverticulosis and colonic polyps-removed as described above. Polyps benign. Next colonoscopy 04/2021.  . TUBAL LIGATION      There were no vitals filed for this visit.   Subjective Assessment - 06/17/20 0900    Subjective COVID 19 screening performed upon arrival. Patient arrived doing well just ongoing pain symptoms    Pertinent History HTN, anemia, back surgery 2015    How long can you sit comfortably? 30-45 min    How long can you stand comfortably? 30-45 min    How long can you walk comfortably? 30-45 min    Diagnostic tests MRI: disc bulge at L5/S1 right    Patient Stated Goals to get rid of pain    Currently in Pain? Yes    Pain Score 5     Pain Location Back    Pain Orientation Right;Lower    Pain Descriptors / Indicators Discomfort    Pain Type Chronic pain    Pain Onset More than a month ago    Pain Frequency Constant    Aggravating Factors  increased activity    Pain Relieving Factors at rest                              [facility] Adult PT Treatment/Exercise - 06/17/20 0001      Lumbar Exercises: Aerobic   Nustep L4 x10 min      Lumbar Exercises: Standing   Shoulder Extension Strengthening;Both;20 reps;Limitations    Shoulder Extension Limitations Blue XTS    Other Standing Lumbar Exercises standing side stepping with edcuational cues on correct posture into mini sqaut for lifting technique at work, followed by 14# bos lifting mat to table to similate work lifting    Other Standing Lumbar Exercises standing 2# reach outs and diagnols 2x10 each      Lumbar Exercises: Supine   Bridge with clamshell 3 seconds;20 reps   with red band   Straight Leg Raise 3 seconds   2x10     Lumbar Exercises: Sidelying   Clam Both;20 reps   red band     Lumbar Exercises: Prone   Straight Leg Raise 15 reps;2 seconds   with abdominal bracing     Lumbar Exercises: Quadruped   Straight Leg Raise 2 seconds   2x10   Other Quadruped Lumbar Exercises childs pose  stretch forward side to side                       PT Long Term Goals - 06/17/20 0904      PT LONG TERM GOAL #1   Title I with HEP with functional strengthening for low/mid and upper back, core strength and LE flexibility    Time 6    Period Weeks    Status On-going      PT LONG TERM GOAL #2   Title Pt to report decreased radicular sx into R LE by 75% or more with ADLS.    Baseline ongoing LE symptoms 06/17/20    Time 6    Period Weeks    Status On-going      PT LONG TERM GOAL #3   Title demo correct body mechanics for performing lifting tasks (with fries) at work and be ind in upper back HEP strength to assist this.    Time 6    Period Weeks    Status On-going      PT LONG TERM GOAL #4   Title Patient able to sleep on right side without waking from pain.    Baseline able to sleep on right some this weekend 06/03/20    Time 6    Period Weeks    Status Partially Met      PT LONG TERM GOAL #5   Title  Improved bil hip ABD to 4+/5 or better in order to perform functional activites.    Time 6    Period Weeks    Status On-going                 Plan - 06/17/20 0944    Clinical Impression Statement Patient tolerated treatment well today.Patient able to progress with abdominal bracing and started lifting technique to simulate work activity. Patient attempted pland prone and side and unable today. Patient continues to have LE symptoms yet not as bad per reported. Goals progressing.    Personal Factors and Comorbidities Comorbidity 3+;Time since onset of injury/illness/exacerbation    Comorbidities HTN, anemia, back surgery 2015    Examination-Activity Limitations Carry;Lift;Sleep;Stand;Locomotion Level    Stability/Clinical Decision Making Stable/Uncomplicated    Rehab Potential Excellent    PT Frequency 2x / week    PT Duration 6 weeks    PT Treatment/Interventions ADLs/Self Care Home Management;Cryotherapy;Electrical Stimulation;Moist Heat;Traction;Neuromuscular re-education;Therapeutic exercise;Therapeutic activities;Patient/family education;Manual techniques;Dry needling    PT Next Visit Plan cont with POC progress extension biased TE; ITB, piriformis mobility; 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 Tobacco use 03/22/2020  . Vitamin D deficiency 04/08/2016  . Essential hypertension 04/08/2016  . Hyperlipidemia 04/08/2016  . Iron deficiency anemia 04/08/2016  . Anxiety disorder 02/06/2015  . Major depressive  disorder, single episode 02/06/2015    Phillips Climes, PTA 06/17/2020, 9:54 AM  [facility] [place], [place], [address] Phone: [phone]   Fax:  [phone]  Name: Bob Eastwood Purewal MRN: [id number] Date of Birth: 09-03-1960

## 2020-06-19 ENCOUNTER — Encounter: Payer: 59 | Admitting: Physical Therapy

## 2020-06-24 ENCOUNTER — Other Ambulatory Visit: Payer: Self-pay

## 2020-06-24 ENCOUNTER — Ambulatory Visit: Payer: 59

## 2020-06-24 DIAGNOSIS — M5416 Radiculopathy, lumbar region: Secondary | ICD-10-CM | POA: Diagnosis not present

## 2020-06-24 NOTE — Patient Instructions (Signed)
Piriformis Stretch, Sitting    Sit, one ankle on opposite knee, same-side hand on crossed knee. Push down on knee, keeping spine straight. Lean torso forward, with flat back, until tension is felt in hamstrings and gluteals of crossed-leg side. Hold _30__ seconds. Repeat _3__ times per session. Do _2__ sessions per day.  Copyright  [employer]. All rights reserved.   

## 2020-06-24 NOTE — Therapy (Signed)
[place], [place], [address] Phone: [phone]   Fax:  [phone]  Physical Therapy Treatment  Patient Details  Name: Monica Elliott MRN: [id number] Date of Birth: 23-Oct-1960 Referring Provider (PT): Rhina Brackett MD   Encounter Date: 06/24/2020   PT End of Session - 06/24/20 0937    Visit Number 7    Number of Visits 12    Date for PT Re-Evaluation 07/10/20    Authorization Type [employer]    PT Start Time 0900    PT Stop Time 0944    PT Time Calculation (min) 44 min    Activity Tolerance Patient tolerated treatment well    Behavior During Therapy [facility] for tasks assessed/performed           Past Medical History:  Diagnosis Date  . Anemia    states low iron  . Depression   . Hypercholesterolemia   . Hypertension   . Vitamin D deficiency     Past Surgical History:  Procedure Laterality Date  . BACK SURGERY  2016  . BREAST BIOPSY    . COLONOSCOPY  04/06/2011   Colonic diverticulosis and colonic polyps-removed as described above. Polyps benign. Next colonoscopy 04/2021.  . TUBAL LIGATION      There were no vitals filed for this visit.   Subjective Assessment - 06/24/20 0904    Subjective COVID 19 screening performed upon arrival. Patient reports increased LBP during chores at home.  Reports the    Pertinent History HTN, anemia, back surgery 2015    Patient Stated Goals to get rid of pain    Currently in Pain? Yes    Pain Score 7     Pain Location Back    Pain Orientation Lower    Pain Descriptors / Indicators Aching;Dull    Pain Type Chronic pain    Pain Radiating Towards Rt knee to toes lateral    Pain Onset More than a month ago    Pain Frequency Constant    Aggravating Factors  increased activity    Pain Relieving Factors at rest    Effect of Pain on Daily Activities just pushes through it              [facility] PT Assessment - 06/24/20 0001      Assessment   Medical  Diagnosis low back pain with possible radiculopathy; right greater trochanteric bursitis    Referring Provider (PT) Rhina Brackett MD    Onset Date/Surgical Date 02/03/20    Hand Dominance Right    Next MD Visit 07/15/20    Prior Therapy yes after back surgery                         [place] [facility] Adult PT Treatment/Exercise - 06/24/20 0001        Exercises   Exercises Lumbar      Lumbar Exercises: Stretches   Double Knee to Chest Stretch 2 reps;20 seconds    Pelvic Tilt 5 reps    Pelvic Tilt Limitations difficult to acheive posterior pelvic tilt    Piriformis Stretch Right;Left;3 reps;20 seconds    Piriformis Stretch Limitations supine figure 4      Lumbar Exercises: Aerobic   Nustep L4 x10 min      Lumbar Exercises: Standing   Shoulder Extension Strengthening;Both;20 reps;Limitations    Shoulder Extension Limitations blue    Other Standing Lumbar Exercises standing side stepping with edcuational cues on correct  posture into mini sqaut for lifting technique at work, followed by 14# bos lifting mat to table to similate work lifting    Other Standing Lumbar Exercises paloff in partial tandem stance 4x 10 with GTB      Lumbar Exercises: Supine   Bridge with clamshell 3 seconds;20 reps   2 sets x 10 reps     Lumbar Exercises: Quadruped   Single Arm Raise Right;Left;5 reps;2 seconds    Single Arm Raises Limitations cueing for ab set and back alignment    Straight Leg Raise 5 reps;3 seconds    Straight Leg Raises Limitations cueing for ab set and back alignment                       PT Long Term Goals - 06/17/20 0904      PT LONG TERM GOAL #1   Title I with HEP with functional strengthening for low/mid and upper back, core strength and LE flexibility    Time 6    Period Weeks    Status On-going      PT LONG TERM GOAL #2   Title Pt to report decreased radicular sx into R LE by 75% or more with ADLS.    Baseline ongoing LE symptoms 06/17/20    Time 6     Period Weeks    Status On-going      PT LONG TERM GOAL #3   Title demo correct body mechanics for performing lifting tasks (with fries) at work and be ind in upper back HEP strength to assist this.    Time 6    Period Weeks    Status On-going      PT LONG TERM GOAL #4   Title Patient able to sleep on right side without waking from pain.    Baseline able to sleep on right some this weekend 06/03/20    Time 6    Period Weeks    Status Partially Met      PT LONG TERM GOAL #5   Title Improved bil hip ABD to 4+/5 or better in order to perform functional activites.    Time 6    Period Weeks    Status On-going                 Plan - 06/24/20 1100    Clinical Impression Statement Pt tolerated well towards session.  Presents with decreased abdominal and proximal mm strengthen required tactile, verbal and demonstration to improve abdominal bracing with functional lifting activities.  EOS pt reports decreased pain.    Personal Factors and Comorbidities Comorbidity 3+;Time since onset of injury/illness/exacerbation    Comorbidities HTN, anemia, back surgery 2015    Examination-Activity Limitations Carry;Lift;Sleep;Stand;Locomotion Level    Stability/Clinical Decision Making Stable/Uncomplicated    Clinical Decision Making Low    Rehab Potential Excellent    PT Frequency 2x / week    PT Duration 6 weeks    PT Treatment/Interventions ADLs/Self Care Home Management;Cryotherapy;Electrical Stimulation;Moist Heat;Traction;Neuromuscular re-education;Therapeutic exercise;Therapeutic activities;Patient/family education;Manual techniques;Dry needling    PT Next Visit Plan cont with POC progress extension biased TE; ITB, piriformis mobility; lifting for work [employer] and core with reach outs for upper body strength    PT Home Exercise Plan [facility]; 06/24/20: piriformis stretch    Consulted and Agree with Plan of Care Patient           Patient will benefit from skilled  therapeutic intervention in order to improve the following  deficits and impairments:  Pain,Increased muscle spasms,Decreased activity tolerance,Impaired flexibility,Postural dysfunction,Decreased strength  Visit Diagnosis: Radiculopathy, lumbar region     Problem List Patient Active Problem List   Diagnosis Date Noted  . Tobacco use 03/22/2020  . Vitamin D deficiency 04/08/2016  . Essential hypertension 04/08/2016  . Hyperlipidemia 04/08/2016  . Iron deficiency anemia 04/08/2016  . Anxiety disorder 02/06/2015  . Major depressive disorder, single episode 02/06/2015   Ihor Austin, LPTA/CLT; CBIS [phone]   Aldona Lento 06/24/2020, 11:04 AM  [facility] [place], [place], [address] Phone: [phone]   Fax:  [phone]  Name: Kerby Hockley Gusler MRN: [id number] Date of Birth: November 15, 1960

## 2020-06-27 ENCOUNTER — Ambulatory Visit: Payer: 59 | Admitting: Physical Therapy

## 2020-07-08 ENCOUNTER — Encounter: Payer: 59 | Admitting: Physical Therapy

## 2020-07-11 ENCOUNTER — Encounter: Payer: Self-pay | Admitting: Physical Therapy

## 2020-07-11 ENCOUNTER — Other Ambulatory Visit: Payer: Self-pay

## 2020-07-11 ENCOUNTER — Ambulatory Visit: Payer: 59 | Attending: Family Medicine | Admitting: Physical Therapy

## 2020-07-11 DIAGNOSIS — M5416 Radiculopathy, lumbar region: Secondary | ICD-10-CM | POA: Diagnosis present

## 2020-07-11 NOTE — Therapy (Signed)
[name], [place], [address] Phone: [phone]   Fax:  [phone]  Physical Therapy Treatment  Patient Details  Name: Monica Elliott Kalman MRN: [id number] Date of Birth: April 14, 1960 Referring Provider (PT): Rhina Brackett MD   Encounter Date: 07/11/2020   PT End of Session - 07/11/20 0911     Visit Number 8    Number of Visits 12    Date for PT Re-Evaluation 07/10/20    Authorization Type [employer]    PT Start Time 0902    PT Stop Time 0942    PT Time Calculation (min) 40 min    Activity Tolerance Patient tolerated treatment well    Behavior During Therapy [facility] for tasks assessed/performed             Past Medical History:  Diagnosis Date   Anemia    states low iron   Depression    Hypercholesterolemia    Hypertension    Vitamin D deficiency     Past Surgical History:  Procedure Laterality Date   BACK SURGERY  2016   BREAST BIOPSY     COLONOSCOPY  04/06/2011   Colonic diverticulosis and colonic polyps-removed as described above. Polyps benign. Next colonoscopy 04/2021.   TUBAL LIGATION      There were no vitals filed for this visit.   Subjective Assessment - 07/11/20 0909     Subjective COVID 19 screening performed upon arrival. Reports less pain at R knee but now pain is lateral calf.    Pertinent History HTN, anemia, back surgery 2015    How long can you sit comfortably? 30-45 min    How long can you stand comfortably? 30-45 min    How long can you walk comfortably? 30-45 min    Diagnostic tests MRI: disc bulge at L5/S1 right    Patient Stated Goals to get rid of pain    Currently in Pain? Yes    Pain Score 5     Pain Location Back    Pain Orientation Lower    Pain Descriptors / Indicators Dull;Aching    Pain Type Chronic pain    Pain Radiating Towards lateral calf > foot    Pain Onset More than a month ago    Pain Frequency Constant                [facility] PT Assessment -  07/11/20 0001       Assessment   Medical Diagnosis low back pain with possible radiculopathy; right greater trochanteric bursitis    Referring Provider (PT) Rhina Brackett MD    Onset Date/Surgical Date 02/03/20    Hand Dominance Right    Next MD Visit 07/15/20    Prior Therapy yes after back surgery      Precautions   Precaution Comments lumbar bulging disc L5/S1      Restrictions   Weight Bearing Restrictions No      ROM / Strength   AROM / PROM / Strength Strength      Strength   Overall Strength Within functional limits for tasks performed    Strength Assessment Site Hip    Right/Left Hip Right;Left    Right Hip ABduction 4+/5    Left Hip ABduction 4/5                           [facility] Adult PT Treatment/Exercise - 07/11/20 0001       Therapeutic  Activites    Therapeutic Activities Lifting    Lifting Assessing lifting technique; deficient squat technique      Lumbar Exercises: Stretches   Single Knee to Chest Stretch Right;Left;3 reps;30 seconds    Piriformis Stretch Right;Left;3 reps;30 seconds    Piriformis Stretch Limitations supine figure 4      Lumbar Exercises: Aerobic   Nustep L4 x10 min      Lumbar Exercises: Standing   Wall Slides 15 reps;2 seconds    Shoulder Extension Strengthening;Both;20 reps;Limitations    Shoulder Extension Limitations Blue XTS      Lumbar Exercises: Supine   Bridge with clamshell 20 reps;3 seconds   green theraband   Bridge with [employer] Limitations --      Lumbar Exercises: Sidelying   Clam Both;20 reps    Clam Limitations green theraband      Lumbar Exercises: Prone   Straight Leg Raise 15 reps    Other Prone Lumbar Exercises POE with RLE roadkill x2 min                         PT Long Term Goals - 07/11/20 0933       PT LONG TERM GOAL #1   Title I with HEP with functional strengthening for low/mid and upper back, core strength and LE flexibility    Time 6    Period Weeks     Status Achieved      PT LONG TERM GOAL #2   Title Pt to report decreased radicular sx into R LE by 75% or more with ADLS.    Baseline ongoing LE symptoms 06/17/20    Time 6    Period Weeks    Status Not Met      PT LONG TERM GOAL #3   Title demo correct body mechanics for performing lifting tasks (with fries) at work and be ind in upper back HEP strength to assist this.    Time 6    Period Weeks    Status Not Met      PT LONG TERM GOAL #4   Title Patient able to sleep on right side without waking from pain.    Baseline able to sleep on right some this weekend 06/03/20    Time 6    Period Weeks    Status Partially Met      PT LONG TERM GOAL #5   Title Improved bil hip ABD to 4+/5 or better in order to perform functional activites.    Time 6    Period Weeks    Status Partially Met                   Plan - 07/11/20 1055     Clinical Impression Statement Patient presented in clinic with reports of continued LBP and now radicular pain is down RLE calf to foot. Patient does intermittantly experience radicular pain in R thighs or buttocks area. Patient progressed through postural and core strengthening with no complaints of any increased pain. Patient unable to tolerate prone posturing for prolonged periods but no increased RLE symptoms noted with prone R roadkill. Patient deficient with correct squat technique for lifting. Patient states that at work sometimes she is rushed or gets into a rhythm and disregards lifting techniques. Patient's symptoms remains elevated and unchanged with PT.    Personal Factors and Comorbidities Comorbidity 3+;Time since onset of injury/illness/exacerbation    Comorbidities HTN, anemia, back surgery 2015  Examination-Activity Limitations Carry;Lift;Sleep;Stand;Locomotion Level    Stability/Clinical Decision Making Stable/Uncomplicated    Rehab Potential Excellent    PT Frequency 2x / week    PT Duration 6 weeks    PT Treatment/Interventions  ADLs/Self Care Home Management;Cryotherapy;Electrical Stimulation;Moist Heat;Traction;Neuromuscular re-education;Therapeutic exercise;Therapeutic activities;Patient/family education;Manual techniques;Dry needling    PT Next Visit Plan Continue or D/C per MD discretion.    PT Home Exercise Plan [facility]; 06/24/20: piriformis stretch    Consulted and Agree with Plan of Care Patient             Patient will benefit from skilled therapeutic intervention in order to improve the following deficits and impairments:  Pain, Increased muscle spasms, Decreased activity tolerance, Impaired flexibility, Postural dysfunction, Decreased strength  Visit Diagnosis: Radiculopathy, lumbar region     Problem List Patient Active Problem List   Diagnosis Date Noted   Tobacco use 03/22/2020   Vitamin D deficiency 04/08/2016   Essential hypertension 04/08/2016   Hyperlipidemia 04/08/2016   Iron deficiency anemia 04/08/2016   Anxiety disorder 02/06/2015   Major depressive disorder, single episode 02/06/2015    Standley Brooking, PTA 07/11/20 11:04 AM   [facility] [place], [place], [address] Phone: [phone]   Fax:  [phone]  Name: Kinzie Wickes Luckey MRN: [id number] Date of Birth: [birth year]

## 2020-07-15 ENCOUNTER — Other Ambulatory Visit: Payer: Self-pay | Admitting: Family Medicine

## 2020-07-15 DIAGNOSIS — M545 Low back pain, unspecified: Secondary | ICD-10-CM

## 2020-07-15 DIAGNOSIS — G8929 Other chronic pain: Secondary | ICD-10-CM

## 2020-07-18 ENCOUNTER — Other Ambulatory Visit: Payer: 59

## 2020-07-19 ENCOUNTER — Other Ambulatory Visit: Payer: Self-pay

## 2020-07-19 ENCOUNTER — Encounter: Payer: Self-pay | Admitting: Family Medicine

## 2020-07-19 ENCOUNTER — Ambulatory Visit (INDEPENDENT_AMBULATORY_CARE_PROVIDER_SITE_OTHER): Payer: 59 | Admitting: Family Medicine

## 2020-07-19 VITALS — BP 139/92 | HR 84 | Temp 97.1°F | Ht 68.0 in | Wt 174.0 lb

## 2020-07-19 DIAGNOSIS — F411 Generalized anxiety disorder: Secondary | ICD-10-CM | POA: Diagnosis not present

## 2020-07-19 DIAGNOSIS — E782 Mixed hyperlipidemia: Secondary | ICD-10-CM | POA: Diagnosis not present

## 2020-07-19 DIAGNOSIS — I10 Essential (primary) hypertension: Secondary | ICD-10-CM

## 2020-07-19 DIAGNOSIS — F325 Major depressive disorder, single episode, in full remission: Secondary | ICD-10-CM

## 2020-07-19 DIAGNOSIS — E559 Vitamin D deficiency, unspecified: Secondary | ICD-10-CM

## 2020-07-19 DIAGNOSIS — D509 Iron deficiency anemia, unspecified: Secondary | ICD-10-CM

## 2020-07-19 NOTE — Progress Notes (Signed)
Assessment & Plan:  1. Mixed hyperlipidemia Labs to assess since adding Atorvastatin. - CMP14+EGFR - Lipid panel  2. Essential hypertension Well controlled on current regimen.  - CMP14+EGFR - Lipid panel  3. Major depressive disorder with single episode, in full remission ([facility]) Well controlled on current regimen.  - CMP14+EGFR  4. Generalized anxiety disorder Well controlled on current regimen.  - CMP14+EGFR  5. Iron deficiency anemia, unspecified iron deficiency anemia type Well controlled on current regimen.   6. Vitamin D deficiency Well controlled on current regimen.    Return as directed after labs result.  Hendricks Limes, MSN, APRN, FNP-C [facility]  Subjective:    Patient ID: Monica Elliott, female    DOB: 19-Jul-1960, 60 y.o.   MRN: [id number]  Patient Care Team: Loman Brooklyn, FNP as PCP - General (Family Medicine) Gala Romney Cristopher Estimable, MD (Gastroenterology) Reynold Bowen, NT as Technician (Internal Medicine)   Chief Complaint:  Chief Complaint  Patient presents with   Hypertension   Hyperlipidemia    4 month follow up of chronic medical conditions     HPI: Monica Elliott is a 60 y.o. female presenting on 07/19/2020 for Hypertension and Hyperlipidemia (4 month follow up of chronic medical conditions )  Hyperlipidemia: patient started back on Atorvastatin 4 months ago. She is fasting today.  Anxiety/Depression: patient is doing well on current regimen.   Depression screen [facility] 2/9 07/19/2020 03/22/2020 08/24/2018  Decreased Interest 0 0 1  Down, Depressed, Hopeless 0 0 1  PHQ - 2 Score 0 0 2  Altered sleeping 0 0 1  Tired, decreased energy '1 1 1  ' Change in appetite 0 1 0  Feeling bad or failure about yourself  0 0 1  Trouble concentrating 0 0 0  Moving slowly or fidgety/restless 0 0 0  Suicidal thoughts 0 0 0  PHQ-9 Score '1 2 5  ' Difficult doing work/chores Not difficult at all Not difficult at all -   GAD 7 :  Generalized Anxiety Score 07/19/2020 03/22/2020  Nervous, Anxious, on Edge 0 1  Control/stop worrying 0 0  Worry too much - different things 0 1  Trouble relaxing 0 0  Restless 0 0  Easily annoyed or irritable 0 0  Afraid - awful might happen 0 0  Total GAD 7 Score 0 2  Anxiety Difficulty Not difficult at all Not difficult at all    Iron deficiency anemia: taking an iron supplement.  Vitamin D deficiency: taking a supplement.  New complaints: None  Social history:  Relevant past medical, surgical, family and social history reviewed and updated as indicated. Interim medical history since our last visit reviewed.  Allergies and medications reviewed and updated.  DATA REVIEWED: CHART IN EPIC  ROS: Negative unless specifically indicated above in HPI.    Current Outpatient Medications:    atorvastatin (LIPITOR) 40 MG tablet, Take 1 tablet (40 mg total) by mouth every evening., Disp: 90 tablet, Rfl: 3   BIOTIN PO, Take by mouth., Disp: , Rfl:    ferrous sulfate 325 (65 FE) MG tablet, Take 325 mg by mouth daily., Disp: , Rfl: 12   gabapentin (NEURONTIN) 300 MG capsule, Take 1 capsule by mouth at bedtime., Disp: , Rfl:    lisinopril (ZESTRIL) 20 MG tablet, Take 1 tablet (20 mg total) by mouth daily., Disp: 90 tablet, Rfl: 3   Multiple Vitamin (MULTIVITAMIN) capsule, Take 1 capsule by mouth daily., Disp: , Rfl:    traMADol (  ULTRAM) 50 MG tablet, Take 100 mg by mouth every 6 (six) hours as needed., Disp: , Rfl:    Turmeric (QC TUMERIC COMPLEX PO), Take 1 capsule by mouth daily., Disp: , Rfl:    VITAMIN D PO, Take 1,000 Units by mouth daily., Disp: , Rfl:    No Known Allergies Past Medical History:  Diagnosis Date   Anemia    states low iron   Depression    Hypercholesterolemia    Hypertension    Vitamin D deficiency     Past Surgical History:  Procedure Laterality Date   BACK SURGERY  2016   BREAST BIOPSY     COLONOSCOPY  04/06/2011   Colonic diverticulosis and colonic  polyps-removed as described above. Polyps benign. Next colonoscopy 04/2021.   TUBAL LIGATION      Social History   Socioeconomic History   Marital status: Single    Spouse name: Not on file   Number of children: Not on file   Years of education: Not on file   Highest education level: Not on file  Occupational History   Not on file  Tobacco Use   Smoking status: Every Day    Packs/day: 0.75    Pack years: 0.00    Types: Cigarettes   Smokeless tobacco: Never  Vaping Use   Vaping Use: Never used  Substance and Sexual Activity   Alcohol use: Not Currently   Drug use: No   Sexual activity: Not Currently  Other Topics Concern   Not on file  Social History Narrative   Not on file   Social Determinants of Health   Financial Resource Strain: Not on file  Food Insecurity: Not on file  Transportation Needs: Not on file  Physical Activity: Not on file  Stress: Not on file  Social Connections: Not on file  Intimate Partner Violence: Not on file        Objective:    BP (!) 139/92   Pulse 84   Temp (!) 97.1 F (36.2 C) (Temporal)   Ht '5\' 8"'  (1.727 m)   Wt 174 lb (78.9 kg)   LMP 05/10/2011   SpO2 98%   BMI 26.46 kg/m   Wt Readings from Last 3 Encounters:  07/19/20 174 lb (78.9 kg)  05/24/20 177 lb 9.6 oz (80.6 kg)  03/22/20 176 lb 12.8 oz (80.2 kg)    Physical Exam Vitals reviewed.  Constitutional:      General: She is not in acute distress.    Appearance: Normal appearance. She is overweight. She is not ill-appearing, toxic-appearing or diaphoretic.  HENT:     Head: Normocephalic and atraumatic.  Eyes:     General: No scleral icterus.       Right eye: No discharge.        Left eye: No discharge.     Conjunctiva/sclera: Conjunctivae normal.  Cardiovascular:     Rate and Rhythm: Normal rate and regular rhythm.     Heart sounds: Normal heart sounds. No murmur heard.   No friction rub. No gallop.  Pulmonary:     Effort: Pulmonary effort is normal. No  respiratory distress.     Breath sounds: Normal breath sounds. No stridor. No wheezing, rhonchi or rales.  Musculoskeletal:        General: Normal range of motion.     Cervical back: Normal range of motion.  Skin:    General: Skin is warm and dry.     Capillary Refill: Capillary refill takes less than  2 seconds.  Neurological:     General: No focal deficit present.     Mental Status: She is alert and oriented to person, place, and time. Mental status is at baseline.  Psychiatric:        Mood and Affect: Mood normal.        Behavior: Behavior normal.        Thought Content: Thought content normal.        Judgment: Judgment normal.    Lab Results  Component Value Date   TSH 1.240 03/22/2020   Lab Results  Component Value Date   WBC 8.1 03/22/2020   HGB 14.8 03/22/2020   HCT 43.4 03/22/2020   MCV 85 03/22/2020   PLT 259 03/22/2020   Lab Results  Component Value Date   NA 140 03/22/2020   K 4.9 03/22/2020   CO2 21 03/22/2020   GLUCOSE 87 03/22/2020   BUN 15 03/22/2020   CREATININE 0.69 03/22/2020   BILITOT <0.2 03/22/2020   ALKPHOS 87 03/22/2020   AST 20 03/22/2020   ALT 25 03/22/2020   PROT 7.3 03/22/2020   ALBUMIN 4.5 03/22/2020   CALCIUM 9.6 03/22/2020   GFR 139.97 10/13/2016   Lab Results  Component Value Date   CHOL 254 (H) 03/22/2020   Lab Results  Component Value Date   HDL 57 03/22/2020   Lab Results  Component Value Date   LDLCALC 180 (H) 03/22/2020   Lab Results  Component Value Date   TRIG 98 03/22/2020   Lab Results  Component Value Date   CHOLHDL 4.5 (H) 03/22/2020   Lab Results  Component Value Date   HGBA1C 5.5 03/22/2020

## 2020-07-20 LAB — LIPID PANEL
Chol/HDL Ratio: 3.3 ratio (ref 0.0–4.4)
Cholesterol, Total: 166 mg/dL (ref 100–199)
HDL: 51 mg/dL (ref >39)
LDL Chol Calc (NIH): 94 mg/dL (ref 0–99)
Triglycerides: 117 mg/dL (ref 0–149)
VLDL Cholesterol Cal: 21 mg/dL (ref 5–40)

## 2020-07-20 LAB — CMP14+EGFR
ALT: 32 IU/L (ref 0–32)
AST: 25 IU/L (ref 0–40)
Albumin/Globulin Ratio: 1.7 (ref 1.2–2.2)
Albumin: 4.5 g/dL (ref 3.8–4.8)
Alkaline Phosphatase: 88 IU/L (ref 44–121)
BUN/Creatinine Ratio: 17 (ref 12–28)
BUN: 12 mg/dL (ref 8–27)
Bilirubin Total: 0.4 mg/dL (ref 0.0–1.2)
CO2: 21 mmol/L (ref 20–29)
Calcium: 9.5 mg/dL (ref 8.7–10.3)
Chloride: 100 mmol/L (ref 96–106)
Creatinine, Ser: 0.72 mg/dL (ref 0.57–1.00)
Globulin, Total: 2.7 g/dL (ref 1.5–4.5)
Glucose: 94 mg/dL (ref 65–99)
Potassium: 4.5 mmol/L (ref 3.5–5.2)
Sodium: 136 mmol/L (ref 134–144)
Total Protein: 7.2 g/dL (ref 6.0–8.5)
eGFR: 94 mL/min/{1.73_m2} (ref >59)

## 2020-07-22 ENCOUNTER — Ambulatory Visit
Admission: RE | Admit: 2020-07-22 | Discharge: 2020-07-22 | Disposition: A | Discharge status: Home / Self Care | Payer: 59 | Source: Ambulatory Visit | Attending: Family Medicine | Admitting: Family Medicine

## 2020-07-22 ENCOUNTER — Other Ambulatory Visit: Payer: Self-pay

## 2020-07-22 ENCOUNTER — Other Ambulatory Visit: Payer: Self-pay | Admitting: Family Medicine

## 2020-07-22 DIAGNOSIS — G8929 Other chronic pain: Secondary | ICD-10-CM

## 2020-07-22 DIAGNOSIS — M545 Low back pain, unspecified: Secondary | ICD-10-CM

## 2020-07-22 MED ORDER — IOPAMIDOL (ISOVUE-M 200) INJECTION 41%
1.0000 mL | Freq: Once | INTRAMUSCULAR | Status: AC
  Administered 2020-07-22: 1 mL via EPIDURAL

## 2020-07-22 MED ORDER — METHYLPREDNISOLONE ACETATE 40 MG/ML INJ SUSP (RADIOLOG
80.0000 mg | Freq: Once | INTRAMUSCULAR | Status: AC
  Administered 2020-07-22: 80 mg via EPIDURAL

## 2020-07-22 NOTE — Discharge Instructions (Signed)
Post Procedure Spinal Discharge Instruction Sheet  You may resume a regular diet and any medications that you routinely take (including pain medications).  No driving day of procedure.  Light activity throughout the rest of the day.  Do not do any strenuous work, exercise, bending or lifting.  The day following the procedure, you can resume normal physical activity but you should refrain from exercising or physical therapy for at least three days thereafter.   Common Side Effects:  Headaches- take your usual medications as directed by your physician.  Increase your fluid intake.  Caffeinated beverages may be helpful.  Lie flat in bed until your headache resolves.  Restlessness or inability to sleep- you may have trouble sleeping for the next few days.  Ask your referring physician if you need any medication for sleep.  Facial flushing or redness- should subside within a few days.  Increased pain- a temporary increase in pain a day or two following your procedure is not unusual.  Take your pain medication as prescribed by your referring physician.  Leg cramps  Please contact our office at [phone] for the following symptoms: Fever greater than 100 degrees. Headaches unresolved with medication after 2-3 days. Increased swelling, pain, or redness at injection site.   Thank you for visiting [facility] today.   

## 2020-09-17 ENCOUNTER — Telehealth: Payer: Self-pay | Admitting: Family Medicine

## 2020-09-17 NOTE — Telephone Encounter (Signed)
Left message to call back  

## 2020-09-17 NOTE — Telephone Encounter (Signed)
Anxiety? Actual nerves? What is going on?

## 2020-10-01 NOTE — Telephone Encounter (Signed)
Unable to reach patient and she never returned call

## 2020-10-24 ENCOUNTER — Ambulatory Visit (INDEPENDENT_AMBULATORY_CARE_PROVIDER_SITE_OTHER): Payer: 59 | Admitting: Family Medicine

## 2020-10-24 ENCOUNTER — Encounter: Payer: Self-pay | Admitting: Family Medicine

## 2020-10-24 ENCOUNTER — Other Ambulatory Visit: Payer: Self-pay

## 2020-10-24 VITALS — BP 139/92 | HR 96 | Temp 97.3°F | Ht 68.0 in | Wt 174.0 lb

## 2020-10-24 DIAGNOSIS — E782 Mixed hyperlipidemia: Secondary | ICD-10-CM

## 2020-10-24 DIAGNOSIS — F411 Generalized anxiety disorder: Secondary | ICD-10-CM

## 2020-10-24 DIAGNOSIS — Z720 Tobacco use: Secondary | ICD-10-CM | POA: Diagnosis not present

## 2020-10-24 DIAGNOSIS — I10 Essential (primary) hypertension: Secondary | ICD-10-CM | POA: Diagnosis not present

## 2020-10-24 DIAGNOSIS — F325 Major depressive disorder, single episode, in full remission: Secondary | ICD-10-CM | POA: Diagnosis not present

## 2020-10-24 DIAGNOSIS — S60151A Contusion of right little finger with damage to nail, initial encounter: Secondary | ICD-10-CM

## 2020-10-24 MED ORDER — BUPROPION HCL ER (SR) 150 MG PO TB12: 150.0000 mg | ORAL_TABLET | Freq: Two times a day (BID) | ORAL | 1 refills | Status: DC

## 2020-10-24 NOTE — Progress Notes (Signed)
Assessment & Plan:  1. Essential hypertension - Well controlled on current regimen - healthy diet and exercise - Anemia Profile B - CMP14+EGFR - Lipid panel  2. Mixed hyperlipidemia - Well controlled on current regimen - encouraged healthy diet and exercise - CMP14+EGFR - Lipid panel  3. Major depressive disorder with single episode, in full remission ([place]) - Well controlled on current regimen - CMP14+EGFR  4. Generalized anxiety disorder - Well controlled on current regimen - CMP14+EGFR  5. Tobacco use - 10 minutes of time spent encouraging cessation of smoking cigarettes. Discussed risks to her health. Patient desires a medication to help - Wellbutrin prescribed. She has a prescription for nicotine patches as well from her neurosurgeon. Discussed she cannot smoke while wearing these patches.  - Anemia Profile B - CMP14+EGFR - Lipid panel - buPROPion (WELLBUTRIN SR) 150 MG 12 hr tablet; Take 1 tablet (150 mg total) by mouth 2 (two) times daily. Start with once daily x3 days.  Dispense: 60 tablet; Refill: 1  6. Subungual hematoma of right little finger, initial encounter - encouraged to soak in epsom salt and clean with soap and water - education provided on subungual hematomas   Return in about 6 months (around 04/23/2021) for annual physical.  Lucile Crater, NP Student  I personally was present during the history, physical exam, and medical decision-making activities of this service and have verified that the service and findings are accurately documented in the nurse practitioner student's note.  Hendricks Limes, MSN, APRN, FNP-C [facility]   Subjective:    Patient ID: Monica Elliott, female    DOB: 13-Oct-1960, 60 y.o.   MRN: [id number]  Patient Care Team: Loman Brooklyn, FNP as PCP - General (Family Medicine) Gala Romney Cristopher Estimable, MD (Gastroenterology) Reynold Bowen, NT as Technician (Internal Medicine)   Chief Complaint:  Chief Complaint   Patient presents with   Hypertension   Hyperlipidemia    Check up of chronic medical conditions     HPI: Monica Elliott is a 60 y.o. female presenting on 10/24/2020 for Hypertension and Hyperlipidemia (Check up of chronic medical conditions )  Hypertension: patient is on Lisinopril. She is not taking her blood pressures at home. She is high today in the office but she has not taken her medication this morning.   Hyperlipidemia: patient is on Atorvastatin. She is fasting today.  Anxiety/Depression: patient is doing well on current regimen.   Depression screen [facility] 2/9 10/24/2020 07/19/2020 03/22/2020  Decreased Interest 0 0 0  Down, Depressed, Hopeless 0 0 0  PHQ - 2 Score 0 0 0  Altered sleeping 1 0 0  Tired, decreased energy '1 1 1  ' Change in appetite 0 0 1  Feeling bad or failure about yourself  0 0 0  Trouble concentrating 0 0 0  Moving slowly or fidgety/restless 0 0 0  Suicidal thoughts 0 0 0  PHQ-9 Score '2 1 2  ' Difficult doing work/chores Somewhat difficult Not difficult at all Not difficult at all   GAD 7 : Generalized Anxiety Score 10/24/2020 07/19/2020 03/22/2020  Nervous, Anxious, on Edge 1 0 1  Control/stop worrying 0 0 0  Worry too much - different things 1 0 1  Trouble relaxing 1 0 0  Restless 0 0 0  Easily annoyed or irritable 0 0 0  Afraid - awful might happen 0 0 0  Total GAD 7 Score 3 0 2  Anxiety Difficulty Somewhat difficult Not difficult at all  Not difficult at all   Iron deficiency anemia: taking an iron supplement.  Vitamin B12 deficiency: taking a supplement.  Vitamin D deficiency: taking a supplement.  New complaints: She jammed her right pinky finger in her car door 4 days ago. She states she has been keeping it clean but notices that the nail is detatched from the nail bed. She wants to know how to manage it to promote healing.   Social history:  Relevant past medical, surgical, family and social history reviewed and updated as indicated.  Interim medical history since our last visit reviewed.  Allergies and medications reviewed and updated.  DATA REVIEWED: CHART IN EPIC  ROS: Negative unless specifically indicated above in HPI.    Current Outpatient Medications:    atorvastatin (LIPITOR) 40 MG tablet, Take 1 tablet (40 mg total) by mouth every evening., Disp: 90 tablet, Rfl: 3   BIOTIN PO, Take by mouth., Disp: , Rfl:    ferrous sulfate 325 (65 FE) MG tablet, Take 325 mg by mouth daily., Disp: , Rfl: 12   gabapentin (NEURONTIN) 300 MG capsule, Take 1 capsule by mouth at bedtime., Disp: , Rfl:    lisinopril (ZESTRIL) 20 MG tablet, Take 1 tablet (20 mg total) by mouth daily., Disp: 90 tablet, Rfl: 3   Multiple Vitamin (MULTIVITAMIN) capsule, Take 1 capsule by mouth daily., Disp: , Rfl:    traMADol (ULTRAM) 50 MG tablet, Take 100 mg by mouth every 6 (six) hours as needed., Disp: , Rfl:    Turmeric (QC TUMERIC COMPLEX PO), Take 1 capsule by mouth daily., Disp: , Rfl:    VITAMIN D PO, Take 1,000 Units by mouth daily., Disp: , Rfl:    No Known Allergies Past Medical History:  Diagnosis Date   Anemia    states low iron   Depression    Hypercholesterolemia    Hypertension    Vitamin D deficiency     Past Surgical History:  Procedure Laterality Date   BACK SURGERY  2016   BREAST BIOPSY     COLONOSCOPY  04/06/2011   Colonic diverticulosis and colonic polyps-removed as described above. Polyps benign. Next colonoscopy 04/2021.   TUBAL LIGATION      Social History   Socioeconomic History   Marital status: Single    Spouse name: Not on file   Number of children: Not on file   Years of education: Not on file   Highest education level: Not on file  Occupational History   Not on file  Tobacco Use   Smoking status: Every Day    Packs/day: 0.75    Types: Cigarettes   Smokeless tobacco: Never  Vaping Use   Vaping Use: Never used  Substance and Sexual Activity   Alcohol use: Not Currently   Drug use: No   Sexual  activity: Not Currently  Other Topics Concern   Not on file  Social History Narrative   Not on file   Social Determinants of Health   Financial Resource Strain: Not on file  Food Insecurity: Not on file  Transportation Needs: Not on file  Physical Activity: Not on file  Stress: Not on file  Social Connections: Not on file  Intimate Partner Violence: Not on file        Objective:    BP (!) 139/92   Pulse 96   Temp (!) 97.3 F (36.3 C) (Temporal)   Ht '5\' 8"'  (1.727 m)   Wt 78.9 kg   LMP 05/10/2011   SpO2  98%   BMI 26.46 kg/m   Wt Readings from Last 3 Encounters:  10/24/20 174 lb (78.9 kg)  07/19/20 174 lb (78.9 kg)  05/24/20 177 lb 9.6 oz (80.6 kg)    Physical Exam Vitals reviewed.  Constitutional:      General: She is not in acute distress.    Appearance: Normal appearance. She is overweight. She is not ill-appearing, toxic-appearing or diaphoretic.  HENT:     Head: Normocephalic and atraumatic.  Eyes:     General: No scleral icterus.       Right eye: No discharge.        Left eye: No discharge.     Conjunctiva/sclera: Conjunctivae normal.  Cardiovascular:     Rate and Rhythm: Normal rate and regular rhythm.     Heart sounds: Normal heart sounds. No murmur heard.   No friction rub. No gallop.  Pulmonary:     Effort: Pulmonary effort is normal. No respiratory distress.     Breath sounds: Normal breath sounds. No stridor. No wheezing, rhonchi or rales.  Musculoskeletal:        General: Normal range of motion.     Cervical back: Normal range of motion.  Skin:    General: Skin is warm and dry.     Capillary Refill: Capillary refill takes less than 2 seconds.     Comments: Right pinky hematoma  Neurological:     General: No focal deficit present.     Mental Status: She is alert and oriented to person, place, and time. Mental status is at baseline.  Psychiatric:        Mood and Affect: Mood normal.        Behavior: Behavior normal.        Thought Content:  Thought content normal.        Judgment: Judgment normal.    Lab Results  Component Value Date   TSH 1.240 03/22/2020   Lab Results  Component Value Date   WBC 8.1 03/22/2020   HGB 14.8 03/22/2020   HCT 43.4 03/22/2020   MCV 85 03/22/2020   PLT 259 03/22/2020   Lab Results  Component Value Date   NA 136 07/19/2020   K 4.5 07/19/2020   CO2 21 07/19/2020   GLUCOSE 94 07/19/2020   BUN 12 07/19/2020   CREATININE 0.72 07/19/2020   BILITOT 0.4 07/19/2020   ALKPHOS 88 07/19/2020   AST 25 07/19/2020   ALT 32 07/19/2020   PROT 7.2 07/19/2020   ALBUMIN 4.5 07/19/2020   CALCIUM 9.5 07/19/2020   EGFR 94 07/19/2020   GFR 139.97 10/13/2016   Lab Results  Component Value Date   CHOL 166 07/19/2020   Lab Results  Component Value Date   HDL 51 07/19/2020   Lab Results  Component Value Date   LDLCALC 94 07/19/2020   Lab Results  Component Value Date   TRIG 117 07/19/2020   Lab Results  Component Value Date   CHOLHDL 3.3 07/19/2020   Lab Results  Component Value Date   HGBA1C 5.5 03/22/2020

## 2020-10-25 LAB — ANEMIA PROFILE B
Basophils Absolute: 0.1 10*3/uL (ref 0.0–0.2)
Basos: 1 %
EOS (ABSOLUTE): 0 10*3/uL (ref 0.0–0.4)
Eos: 1 %
Ferritin: 347 ng/mL — ABNORMAL HIGH (ref 15–150)
Folate: 10.9 ng/mL (ref >3.0)
Hematocrit: 40.7 % (ref 34.0–46.6)
Hemoglobin: 13.6 g/dL (ref 11.1–15.9)
Immature Grans (Abs): 0 10*3/uL (ref 0.0–0.1)
Immature Granulocytes: 0 %
Iron Saturation: 29 % (ref 15–55)
Iron: 101 ug/dL (ref 27–139)
Lymphocytes Absolute: 2.3 10*3/uL (ref 0.7–3.1)
Lymphs: 32 %
MCH: 29.6 pg (ref 26.6–33.0)
MCHC: 33.4 g/dL (ref 31.5–35.7)
MCV: 89 fL (ref 79–97)
Monocytes Absolute: 0.5 10*3/uL (ref 0.1–0.9)
Monocytes: 6 %
Neutrophils Absolute: 4.5 10*3/uL (ref 1.4–7.0)
Neutrophils: 60 %
Platelets: 220 10*3/uL (ref 150–450)
RBC: 4.6 x10E6/uL (ref 3.77–5.28)
RDW: 14.2 % (ref 11.7–15.4)
Retic Ct Pct: 1.8 % (ref 0.6–2.6)
Total Iron Binding Capacity: 351 ug/dL (ref 250–450)
UIBC: 250 ug/dL (ref 118–369)
Vitamin B-12: 1140 pg/mL (ref 232–1245)
WBC: 7.4 10*3/uL (ref 3.4–10.8)

## 2020-10-25 LAB — CMP14+EGFR
ALT: 27 IU/L (ref 0–32)
AST: 20 IU/L (ref 0–40)
Albumin/Globulin Ratio: 2.1 (ref 1.2–2.2)
Albumin: 4.6 g/dL (ref 3.8–4.8)
Alkaline Phosphatase: 71 IU/L (ref 44–121)
BUN/Creatinine Ratio: 22 (ref 12–28)
BUN: 15 mg/dL (ref 8–27)
Bilirubin Total: 0.3 mg/dL (ref 0.0–1.2)
CO2: 20 mmol/L (ref 20–29)
Calcium: 9.6 mg/dL (ref 8.7–10.3)
Chloride: 109 mmol/L — ABNORMAL HIGH (ref 96–106)
Creatinine, Ser: 0.69 mg/dL (ref 0.57–1.00)
Globulin, Total: 2.2 g/dL (ref 1.5–4.5)
Glucose: 93 mg/dL (ref 65–99)
Potassium: 4.1 mmol/L (ref 3.5–5.2)
Sodium: 145 mmol/L — ABNORMAL HIGH (ref 134–144)
Total Protein: 6.8 g/dL (ref 6.0–8.5)
eGFR: 98 mL/min/{1.73_m2} (ref >59)

## 2020-10-25 LAB — LIPID PANEL
Chol/HDL Ratio: 3.4 ratio (ref 0.0–4.4)
Cholesterol, Total: 167 mg/dL (ref 100–199)
HDL: 49 mg/dL (ref >39)
LDL Chol Calc (NIH): 98 mg/dL (ref 0–99)
Triglycerides: 111 mg/dL (ref 0–149)
VLDL Cholesterol Cal: 20 mg/dL (ref 5–40)

## 2020-10-29 ENCOUNTER — Other Ambulatory Visit: Payer: Self-pay | Admitting: Orthopedic Surgery

## 2020-11-15 ENCOUNTER — Other Ambulatory Visit: Payer: Self-pay | Admitting: Family Medicine

## 2020-11-15 DIAGNOSIS — Z720 Tobacco use: Secondary | ICD-10-CM

## 2020-11-25 ENCOUNTER — Other Ambulatory Visit: Payer: Self-pay | Admitting: Family Medicine

## 2020-11-25 DIAGNOSIS — Z720 Tobacco use: Secondary | ICD-10-CM

## 2020-11-28 ENCOUNTER — Inpatient Hospital Stay: Admit: 2020-11-28 | Payer: 59 | Admitting: Orthopedic Surgery

## 2020-11-28 SURGERY — TRANSFORAMINAL LUMBAR INTERBODY FUSION (TLIF) WITH PEDICLE SCREW FIXATION 1 LEVEL
Anesthesia: General | Laterality: Right

## 2021-01-29 ENCOUNTER — Encounter: Payer: 59 | Admitting: Nurse Practitioner

## 2021-01-29 ENCOUNTER — Other Ambulatory Visit: Payer: Self-pay

## 2021-01-29 ENCOUNTER — Ambulatory Visit
Admission: EM | Admit: 2021-01-29 | Discharge: 2021-01-29 | Disposition: A | Discharge status: Home / Self Care | Payer: 59 | Attending: Urgent Care | Admitting: Urgent Care

## 2021-01-29 ENCOUNTER — Encounter: Payer: Self-pay | Admitting: Nurse Practitioner

## 2021-01-29 DIAGNOSIS — B349 Viral infection, unspecified: Secondary | ICD-10-CM | POA: Diagnosis not present

## 2021-01-29 DIAGNOSIS — F17200 Nicotine dependence, unspecified, uncomplicated: Secondary | ICD-10-CM

## 2021-01-29 DIAGNOSIS — R52 Pain, unspecified: Secondary | ICD-10-CM | POA: Diagnosis not present

## 2021-01-29 DIAGNOSIS — R0981 Nasal congestion: Secondary | ICD-10-CM

## 2021-01-29 DIAGNOSIS — Z1152 Encounter for screening for COVID-19: Secondary | ICD-10-CM | POA: Diagnosis not present

## 2021-01-29 DIAGNOSIS — Z20828 Contact with and (suspected) exposure to other viral communicable diseases: Secondary | ICD-10-CM

## 2021-01-29 MED ORDER — OSELTAMIVIR PHOSPHATE 75 MG PO CAPS: 75.0000 mg | ORAL_CAPSULE | Freq: Two times a day (BID) | ORAL | 0 refills | Status: DC

## 2021-01-29 NOTE — ED Triage Notes (Signed)
Pt presents with body aches, diarrhea and nasal congestion that began yesterday, had exposure to flu on Saturday

## 2021-01-29 NOTE — Discharge Instructions (Addendum)
We will notify you of your test results as they arrive and may take between 48-72 hours.  I encourage you to sign up for MyChart if you have not already done so as this can be the easiest way for [place] to communicate results to you online or through a phone app.  Generally, we only contact you if it is a positive test result.  In the meantime, if you develop worsening symptoms including fever, chest pain, shortness of breath despite our current treatment plan then please report to the emergency room as this may be a sign of worsening status from possible viral infection.  Otherwise, we will manage this as a viral syndrome such as influenza with Tamiflu. For sore throat or cough try using a honey-based tea. Use 3 teaspoons of honey with juice squeezed from half lemon. Place shaved pieces of ginger into 1/2-1 cup of water and warm over stove top. Then mix the ingredients and repeat every 4 hours as needed. Please take Tylenol 500mg -650mg  every 6 hours for aches and pains, fevers. Hydrate very well with at least 2 liters of water. Eat light meals such as soups to replenish electrolytes and soft fruits, veggies. Start an antihistamine like Zyrtec for postnasal drainage, sinus congestion.  You can take this together with pseudoephedrine (Sudafed) at a dose of 30 mg 2-3 times a day as needed for the same kind of congestion.

## 2021-01-29 NOTE — Progress Notes (Signed)
Erroneous- patient went to urgent care

## 2021-01-29 NOTE — ED Provider Notes (Signed)
[name]   MRN: [id number] DOB: 1960-06-06  Subjective:   Monica Elliott is a 60 y.o. female presenting for 1 day history of acute onset body aches, sinus congestion, sinus headaches, diarrhea, very slight cough.  No chest pain, shortness of breath, abdominal pain.  Patient had very close contact with one of her family members who tested positive for influenza 3 days ago.  She is also a smoker, does 3/4ppd.  No history of respiratory disorders.  No current facility-administered medications for this encounter.  Current Outpatient Medications:    buPROPion (WELLBUTRIN SR) 150 MG 12 hr tablet, TAKE 1 TABLET (150 MG TOTAL) BY MOUTH 2 (TWO) TIMES DAILY. START WITH ONCE DAILY X3 DAYS., Disp: 180 tablet, Rfl: 0   atorvastatin (LIPITOR) 40 MG tablet, Take 1 tablet (40 mg total) by mouth every evening., Disp: 90 tablet, Rfl: 3   BIOTIN PO, Take by mouth., Disp: , Rfl:    ferrous sulfate 325 (65 FE) MG tablet, Take 325 mg by mouth daily., Disp: , Rfl: 12   gabapentin (NEURONTIN) 300 MG capsule, Take 1 capsule by mouth at bedtime., Disp: , Rfl:    lisinopril (ZESTRIL) 20 MG tablet, Take 1 tablet (20 mg total) by mouth daily., Disp: 90 tablet, Rfl: 3   Multiple Vitamin (MULTIVITAMIN) capsule, Take 1 capsule by mouth daily., Disp: , Rfl:    traMADol (ULTRAM) 50 MG tablet, Take 100 mg by mouth every 6 (six) hours as needed., Disp: , Rfl:    Turmeric (QC TUMERIC COMPLEX PO), Take 1 capsule by mouth daily., Disp: , Rfl:    VITAMIN D PO, Take 1,000 Units by mouth daily., Disp: , Rfl:    No Known Allergies  Past Medical History:  Diagnosis Date   Anemia    states low iron   Depression    Hypercholesterolemia    Hypertension    Vitamin D deficiency      Past Surgical History:  Procedure Laterality Date   BACK SURGERY  2016   BREAST BIOPSY     COLONOSCOPY  04/06/2011   Colonic diverticulosis and colonic polyps-removed as described above. Polyps benign. Next colonoscopy 04/2021.    TUBAL LIGATION      Family History  Problem Relation Age of Onset   Heart disease Mother    Hypertension Mother    Diabetes Mother    Hypertension Father    Alcohol abuse Brother    Diabetes Sister    Colon cancer Neg Hx     Social History   Tobacco Use   Smoking status: Every Day    Packs/day: 0.75    Types: Cigarettes   Smokeless tobacco: Never  Vaping Use   Vaping Use: Never used  Substance Use Topics   Alcohol use: Not Currently   Drug use: No    ROS   Objective:   Vitals: BP (!) 145/91    Pulse (!) 112    Temp 99 F (37.2 C)    Resp 20    LMP 05/10/2011    SpO2 94%   Physical Exam Constitutional:      General: She is not in acute distress.    Appearance: Normal appearance. She is well-developed. She is not ill-appearing, toxic-appearing or diaphoretic.  HENT:     Head: Normocephalic and atraumatic.     Nose: Nose normal.     Mouth/Throat:     Mouth: Mucous membranes are moist.  Eyes:     Extraocular Movements: Extraocular movements intact.  Pupils: Pupils are equal, round, and reactive to light.  Cardiovascular:     Rate and Rhythm: Normal rate and regular rhythm.     Pulses: Normal pulses.     Heart sounds: Normal heart sounds. No murmur heard.   No friction rub. No gallop.  Pulmonary:     Effort: Pulmonary effort is normal. No respiratory distress.     Breath sounds: Normal breath sounds. No stridor. No wheezing, rhonchi or rales.  Skin:    General: Skin is warm and dry.     Findings: No rash.  Neurological:     Mental Status: She is alert and oriented to person, place, and time.  Psychiatric:        Mood and Affect: Mood normal.        Behavior: Behavior normal.        Thought Content: Thought content normal.    Assessment and Plan :   PDMP not reviewed this encounter.  1. Acute viral syndrome   2. Encounter for screening for COVID-19   3. Body aches   4. Exposure to influenza   5. Smoker   6. Sinus congestion    Deferred  imaging given clear cardiopulmonary exam, hemodynamically stable vital signs.  Will cover for influenza with Tamiflu given exposure, symptom set, current incidence in the community.  Use supportive care, rest, fluids, hydration, light meals, schedule Tylenol and ibuprofen. Counseled patient on potential for adverse effects with medications prescribed today, patient verbalized understanding. ER and return-to-clinic precautions discussed, patient verbalized understanding.    [name], [place] 01/29/21 [phone]

## 2021-01-30 LAB — COVID-19, FLU A+B NAA
Influenza A, NAA: DETECTED — AB
Influenza B, NAA: NOT DETECTED
SARS-CoV-2, NAA: NOT DETECTED

## 2021-02-23 ENCOUNTER — Other Ambulatory Visit: Payer: Self-pay | Admitting: Family Medicine

## 2021-02-23 DIAGNOSIS — E782 Mixed hyperlipidemia: Secondary | ICD-10-CM

## 2021-02-23 DIAGNOSIS — I10 Essential (primary) hypertension: Secondary | ICD-10-CM

## 2021-04-10 ENCOUNTER — Encounter: Payer: Self-pay | Admitting: *Deleted

## 2021-04-17 ENCOUNTER — Encounter: Payer: Self-pay | Admitting: Family Medicine

## 2021-04-17 ENCOUNTER — Ambulatory Visit (INDEPENDENT_AMBULATORY_CARE_PROVIDER_SITE_OTHER): Payer: 59 | Admitting: Family Medicine

## 2021-04-17 VITALS — BP 135/86 | HR 96 | Temp 97.6°F | Ht 68.0 in | Wt 186.6 lb

## 2021-04-17 DIAGNOSIS — B9689 Other specified bacterial agents as the cause of diseases classified elsewhere: Secondary | ICD-10-CM | POA: Diagnosis not present

## 2021-04-17 DIAGNOSIS — J069 Acute upper respiratory infection, unspecified: Secondary | ICD-10-CM | POA: Diagnosis not present

## 2021-04-17 MED ORDER — AZITHROMYCIN 250 MG PO TABS: ORAL_TABLET | ORAL | 0 refills | Status: DC

## 2021-04-17 NOTE — Progress Notes (Signed)
? ?Assessment & Plan:  ?1. Bacterial upper respiratory infection ?Education provided on upper respiratory infections. Discussed symptom management. ?- azithromycin (ZITHROMAX Z-PAK) 250 MG tablet; Take 2 tablets (500 mg) PO today, then 1 tablet (250 mg) PO daily x4 days.  Dispense: 6 tablet; Refill: 0 ? ? ?Follow up plan: Return if symptoms worsen or fail to improve. ? ?Hendricks Limes, MSN, APRN, FNP-C ?[place] ? ?Subjective:  ? ?Patient ID: Monica Elliott, female    DOB: [birth year], 61 y.o.   MRN: [id number] ? ?HPI: ?Monica Elliott is a 61 y.o. female presenting on 04/17/2021 for Nasal Congestion, Cough, and Headache (X2 weeks ) ? ?Patient complains of cough, head/chest congestion, headache, runny nose, sneezing, and postnasal drainage. Onset of symptoms was 2 weeks ago, unchanged since that time. She is drinking plenty of fluids. Evaluation to date: at home COVID test negative. Treatment to date: antihistamines. She does smoke.  ? ? ?ROS: Negative unless specifically indicated above in HPI.  ? ?Relevant past medical history reviewed and updated as indicated.  ? ?Allergies and medications reviewed and updated. ? ? ?Current Outpatient Medications:  ?  atorvastatin (LIPITOR) 40 MG tablet, TAKE 1 TABLET BY MOUTH EVERY DAY IN THE EVENING, Disp: 90 tablet, Rfl: 0 ?  BIOTIN PO, Take by mouth., Disp: , Rfl:  ?  buPROPion (WELLBUTRIN SR) 150 MG 12 hr tablet, TAKE 1 TABLET (150 MG TOTAL) BY MOUTH 2 (TWO) TIMES DAILY. START WITH ONCE DAILY X3 DAYS., Disp: 180 tablet, Rfl: 0 ?  ferrous sulfate 325 (65 FE) MG tablet, Take 325 mg by mouth daily., Disp: , Rfl: 12 ?  gabapentin (NEURONTIN) 300 MG capsule, Take 1 capsule by mouth at bedtime., Disp: , Rfl:  ?  lisinopril (ZESTRIL) 20 MG tablet, TAKE 1 TABLET BY MOUTH EVERY DAY, Disp: 90 tablet, Rfl: 0 ?  Multiple Vitamin (MULTIVITAMIN) capsule, Take 1 capsule by mouth daily., Disp: , Rfl:  ?  traMADol (ULTRAM) 50 MG tablet, Take 100 mg by mouth every 6  (six) hours as needed., Disp: , Rfl:  ?  Turmeric (QC TUMERIC COMPLEX PO), Take 1 capsule by mouth daily., Disp: , Rfl:  ?  VITAMIN D PO, Take 1,000 Units by mouth daily., Disp: , Rfl:  ? ?No Known Allergies ? ?Objective:  ? ?BP 135/86   Pulse 96   Temp 97.6 ?F (36.4 ?C) (Temporal)   Ht '5\' 8"'$  (1.727 m)   Wt 186 lb 9.6 oz (84.6 kg)   LMP 05/10/2011   SpO2 93%   BMI 28.37 kg/m?   ? ?Physical Exam ?Vitals reviewed.  ?Constitutional:   ?   General: She is not in acute distress. ?   Appearance: Normal appearance. She is not ill-appearing, toxic-appearing or diaphoretic.  ?HENT:  ?   Head: Normocephalic and atraumatic.  ?   Right Ear: Tympanic membrane, ear canal and external ear normal. There is no impacted cerumen.  ?   Left Ear: Tympanic membrane, ear canal and external ear normal. There is no impacted cerumen.  ?   Nose: Nose normal. No congestion or rhinorrhea.  ?   Mouth/Throat:  ?   Mouth: Mucous membranes are moist.  ?   Pharynx: Oropharynx is clear. No oropharyngeal exudate or posterior oropharyngeal erythema.  ?Eyes:  ?   General: No scleral icterus.    ?   Right eye: No discharge.     ?   Left eye: No discharge.  ?   Conjunctiva/sclera: Conjunctivae normal.  ?  Cardiovascular:  ?   Rate and Rhythm: Normal rate and regular rhythm.  ?   Heart sounds: Normal heart sounds. No murmur heard. ?  No friction rub. No gallop.  ?Pulmonary:  ?   Effort: Pulmonary effort is normal. No respiratory distress.  ?   Breath sounds: Normal breath sounds. No stridor. No wheezing, rhonchi or rales.  ?Musculoskeletal:     ?   General: Normal range of motion.  ?   Cervical back: Normal range of motion.  ?Lymphadenopathy:  ?   Cervical: No cervical adenopathy.  ?Skin: ?   General: Skin is warm and dry.  ?   Capillary Refill: Capillary refill takes less than 2 seconds.  ?Neurological:  ?   General: No focal deficit present.  ?   Mental Status: She is alert and oriented to person, place, and time. Mental status is at baseline.   ?Psychiatric:     ?   Mood and Affect: Mood normal.     ?   Behavior: Behavior normal.     ?   Thought Content: Thought content normal.     ?   Judgment: Judgment normal.  ? ? ? ? ? ? ?

## 2021-04-23 ENCOUNTER — Ambulatory Visit (INDEPENDENT_AMBULATORY_CARE_PROVIDER_SITE_OTHER): Payer: 59 | Admitting: Family Medicine

## 2021-04-23 ENCOUNTER — Encounter: Payer: Self-pay | Admitting: Family Medicine

## 2021-04-23 ENCOUNTER — Encounter: Payer: 59 | Admitting: Family Medicine

## 2021-04-23 VITALS — BP 134/78 | HR 97 | Temp 97.6°F | Ht 68.0 in | Wt 186.0 lb

## 2021-04-23 DIAGNOSIS — F411 Generalized anxiety disorder: Secondary | ICD-10-CM

## 2021-04-23 DIAGNOSIS — I10 Essential (primary) hypertension: Secondary | ICD-10-CM

## 2021-04-23 DIAGNOSIS — Z0001 Encounter for general adult medical examination with abnormal findings: Secondary | ICD-10-CM

## 2021-04-23 DIAGNOSIS — F325 Major depressive disorder, single episode, in full remission: Secondary | ICD-10-CM

## 2021-04-23 DIAGNOSIS — D509 Iron deficiency anemia, unspecified: Secondary | ICD-10-CM

## 2021-04-23 DIAGNOSIS — Z1211 Encounter for screening for malignant neoplasm of colon: Secondary | ICD-10-CM

## 2021-04-23 DIAGNOSIS — E559 Vitamin D deficiency, unspecified: Secondary | ICD-10-CM

## 2021-04-23 DIAGNOSIS — K644 Residual hemorrhoidal skin tags: Secondary | ICD-10-CM

## 2021-04-23 DIAGNOSIS — Z0000 Encounter for general adult medical examination without abnormal findings: Secondary | ICD-10-CM

## 2021-04-23 DIAGNOSIS — E782 Mixed hyperlipidemia: Secondary | ICD-10-CM | POA: Diagnosis not present

## 2021-04-23 MED ORDER — ATORVASTATIN CALCIUM 40 MG PO TABS: 40.0000 mg | ORAL_TABLET | Freq: Every day | ORAL | 3 refills | Status: DC

## 2021-04-23 MED ORDER — HYDROCORTISONE (PERIANAL) 2.5 % EX CREA: 1.0000 "application " | TOPICAL_CREAM | Freq: Two times a day (BID) | CUTANEOUS | 2 refills | Status: DC

## 2021-04-23 MED ORDER — LISINOPRIL 20 MG PO TABS: 20.0000 mg | ORAL_TABLET | Freq: Every day | ORAL | 3 refills | Status: DC

## 2021-04-23 NOTE — Progress Notes (Signed)
? ?Assessment & Plan:  ?1. Well adult exam ?Preventive health education provided.  ?- Anemia Profile B ?- CMP14+EGFR ?- Lipid panel ? ?2. Essential hypertension ?Well controlled on current regimen.  ?- lisinopril (ZESTRIL) 20 MG tablet; Take 1 tablet (20 mg total) by mouth daily.  Dispense: 90 tablet; Refill: 3 ?- Anemia Profile B ?- CMP14+EGFR ?- Lipid panel ?- atorvastatin (LIPITOR) 40 MG tablet; Take 1 tablet (40 mg total) by mouth daily.  Dispense: 90 tablet; Refill: 3 ? ?3. Mixed hyperlipidemia ?Well controlled on current regimen.  ?- Anemia Profile B ?- CMP14+EGFR ?- Lipid panel ?- atorvastatin (LIPITOR) 40 MG tablet; Take 1 tablet (40 mg total) by mouth daily.  Dispense: 90 tablet; Refill: 3 ? ?4. Generalized anxiety disorder ?Resolved.  ? ?5. Major depressive disorder with single episode, in full remission ([place]) ?Resolved.  ? ?6. Iron deficiency anemia, unspecified iron deficiency anemia type ?Labs to assess. ?- Anemia Profile B ? ?7. Vitamin D deficiency ?Labs to assess. ?- VITAMIN D 25 Hydroxy (Vit-D Deficiency, Fractures) ? ?8. External hemorrhoid ?Patient to continue using Tucks pads.  Recommended Preparation H and Anusol cream. ?- hydrocortisone (ANUSOL-HC) 2.5 % rectal cream; Place 1 application. rectally 2 (two) times daily.  Dispense: 30 g; Refill: 2 ? ?9. Colon cancer screening ?- Ambulatory referral to Gastroenterology ? ? ?Follow-up: Return in about 1 year (around 04/24/2022) for annual physical.  ? ?Hendricks Limes, MSN, APRN, FNP-C ?[facility] ? ?Subjective:  ?Patient ID: Monica Elliott, female    DOB: 1961-01-28  Age: 61 y.o. MRN: [id number] ? ?Patient Care Team: ?Loman Brooklyn, FNP as PCP - General (Family Medicine) ?Rourk, Cristopher Estimable, MD (Gastroenterology) ?Reynold Bowen, NT as Technician (Internal Medicine)  ? ?CC:  ?Chief Complaint  ?Patient presents with  ? Annual Exam  ? ? ?HPI ?Monica Elliott presents for her annual physical. ? ?Occupation: Warehouse manager at [employer], Marital status: Widowed, Substance use: None ?Last eye exam: two years ago ?Last dental exam: has dentures ?Last colonoscopy: 04/06/2011 - to repeat in 5 years due to polyps ?Last mammogram: 05/29/2020 ?Last pap smear: 72/18/2022 with recommended repeat in 5 years ?Hepatitis C Screening: 08/30/2018 (negative) ?Immunizations: Flu Vaccine: declined ?Tdap Vaccine: up to date  ?Shingrix Vaccine: declined  ?COVID-19 Vaccine: up to date ? ?Hypertension: patient is on Lisinopril. She does occasionally check her blood pressure at home and reports similar readings to ours today. ? ?Hyperlipidemia: patient is on Atorvastatin. She is fasting today. ? ?Iron deficiency anemia: Patient takes iron supplement daily. ? ?Vitamin D deficiency: Patient takes 1,000 units once daily.  Last vitamin D level WNL in February 2022. ? ?Anxiety/depression: Patient feels she is doing well without medication. ? ? ?  04/23/2021  ?  9:32 AM 04/17/2021  ?  3:55 PM 10/24/2020  ?  8:23 AM  ?Depression screen PHQ 2/9  ?Decreased Interest 0 0 0  ?Down, Depressed, Hopeless 0 0 0  ?PHQ - 2 Score 0 0 0  ?Altered sleeping 0  1  ?Tired, decreased energy 0  1  ?Change in appetite 0  0  ?Feeling bad or failure about yourself  0  0  ?Trouble concentrating 0  0  ?Moving slowly or fidgety/restless 0  0  ?Suicidal thoughts 0  0  ?PHQ-9 Score 0  2  ?Difficult doing work/chores Not difficult at all  Somewhat difficult  ? ? ?  04/23/2021  ?  9:32 AM 10/24/2020  ?  8:23 AM  07/19/2020  ?  8:49 AM 03/22/2020  ?  2:05 PM  ?GAD 7 : Generalized Anxiety Score  ?Nervous, Anxious, on Edge 0 1 0 1  ?Control/stop worrying 0 0 0 0  ?Worry too much - different things 0 1 0 1  ?Trouble relaxing 0 1 0 0  ?Restless 0 0 0 0  ?Easily annoyed or irritable 0 0 0 0  ?Afraid - awful might happen 0 0 0 0  ?Total GAD 7 Score 0 3 0 2  ?Anxiety Difficulty Not difficult at all Somewhat difficult Not difficult at all Not difficult at all  ? ? ?Patient is still occasionally seeing blood in her stool.  She has a history of hemorrhoids. She is using Tuck's pads at times.  ? ? ?Review of Systems  ?Constitutional:  Negative for chills, fever, malaise/fatigue and weight loss.  ?HENT:  Negative for congestion, ear discharge, ear pain, nosebleeds, sinus pain, sore throat and tinnitus.   ?Eyes:  Negative for blurred vision, double vision, pain, discharge and redness.  ?Respiratory:  Negative for cough, shortness of breath and wheezing.   ?Cardiovascular:  Negative for chest pain, palpitations and leg swelling.  ?Gastrointestinal:  Positive for blood in stool. Negative for abdominal pain, constipation, diarrhea, heartburn, nausea and vomiting.  ?Genitourinary:  Negative for dysuria, frequency and urgency.  ?Musculoskeletal:  Negative for myalgias.  ?Skin:  Negative for rash.  ?Neurological:  Negative for dizziness, seizures, weakness and headaches.  ?Psychiatric/Behavioral:  Negative for depression, substance abuse and suicidal ideas. The patient is not nervous/anxious.   ? ? ?Current Outpatient Medications:  ?  BIOTIN PO, Take by mouth., Disp: , Rfl:  ?  ferrous sulfate 325 (65 FE) MG tablet, Take 325 mg by mouth daily., Disp: , Rfl: 12 ?  gabapentin (NEURONTIN) 300 MG capsule, Take 1 capsule by mouth at bedtime., Disp: , Rfl:  ?  hydrocortisone (ANUSOL-HC) 2.5 % rectal cream, Place 1 application. rectally 2 (two) times daily., Disp: 30 g, Rfl: 2 ?  Multiple Vitamin (MULTIVITAMIN) capsule, Take 1 capsule by mouth daily., Disp: , Rfl:  ?  Turmeric (QC TUMERIC COMPLEX PO), Take 1 capsule by mouth daily., Disp: , Rfl:  ?  VITAMIN D PO, Take 1,000 Units by mouth daily., Disp: , Rfl:  ?  atorvastatin (LIPITOR) 40 MG tablet, Take 1 tablet (40 mg total) by mouth daily., Disp: 90 tablet, Rfl: 3 ?  lisinopril (ZESTRIL) 20 MG tablet, Take 1 tablet (20 mg total) by mouth daily., Disp: 90 tablet, Rfl: 3 ? ?No Known Allergies ? ?Past Medical History:  ?Diagnosis Date  ? Anemia   ? states low iron  ? Depression   ?  Hypercholesterolemia   ? Hypertension   ? Vitamin D deficiency   ? ? ?Past Surgical History:  ?Procedure Laterality Date  ? BACK SURGERY  2016  ? BREAST BIOPSY    ? COLONOSCOPY  04/06/2011  ? Colonic diverticulosis and colonic polyps-removed as described above. Polyps benign. Next colonoscopy 04/2021.  ? TUBAL LIGATION    ? ? ?Family History  ?Problem Relation Age of Onset  ? Heart disease Mother   ? Hypertension Mother   ? Diabetes Mother   ? Hypertension Father   ? Alcohol abuse Brother   ? Diabetes Sister   ? Colon cancer Neg Hx   ? ? ?Social History  ? ?Socioeconomic History  ? Marital status: Single  ?  Spouse name: Not on file  ? Number of children: Not on file  ?  Years of education: Not on file  ? Highest education level: Not on file  ?Occupational History  ? Not on file  ?Tobacco Use  ? Smoking status: Every Day  ?  Packs/day: 0.75  ?  Types: Cigarettes  ? Smokeless tobacco: Never  ?Vaping Use  ? Vaping Use: Never used  ?Substance and Sexual Activity  ? Alcohol use: Not Currently  ? Drug use: No  ? Sexual activity: Not Currently  ?Other Topics Concern  ? Not on file  ?Social History Narrative  ? Not on file  ? ?Social Determinants of Health  ? ?Financial Resource Strain: Not on file  ?Food Insecurity: Not on file  ?Transportation Needs: Not on file  ?Physical Activity: Not on file  ?Stress: Not on file  ?Social Connections: Not on file  ?Intimate Partner Violence: Not on file  ? ? ?  ?Objective:  ?  ?BP 134/78   Pulse 97   Temp 97.6 ?F (36.4 ?C) (Temporal)   Ht '5\' 8"'  (1.727 m)   Wt 186 lb (84.4 kg)   LMP 05/10/2011   SpO2 92%   BMI 28.28 kg/m?  ? ?Wt Readings from Last 3 Encounters:  ?04/23/21 186 lb (84.4 kg)  ?04/17/21 186 lb 9.6 oz (84.6 kg)  ?10/24/20 174 lb (78.9 kg)  ? ? ?Physical Exam ?Vitals reviewed.  ?Constitutional:   ?   General: She is not in acute distress. ?   Appearance: Normal appearance. She is overweight. She is not ill-appearing, toxic-appearing or diaphoretic.  ?HENT:  ?   Head:  Normocephalic and atraumatic.  ?   Right Ear: Tympanic membrane, ear canal and external ear normal. There is no impacted cerumen.  ?   Left Ear: Tympanic membrane, ear canal and external ear normal. There is no impacted cerumen.

## 2021-04-24 LAB — CMP14+EGFR
ALT: 39 IU/L — ABNORMAL HIGH (ref 0–32)
AST: 24 IU/L (ref 0–40)
Albumin/Globulin Ratio: 1.9 (ref 1.2–2.2)
Albumin: 4.5 g/dL (ref 3.8–4.8)
Alkaline Phosphatase: 77 IU/L (ref 44–121)
BUN/Creatinine Ratio: 22 (ref 12–28)
BUN: 17 mg/dL (ref 8–27)
Bilirubin Total: 0.3 mg/dL (ref 0.0–1.2)
CO2: 21 mmol/L (ref 20–29)
Calcium: 9.6 mg/dL (ref 8.7–10.3)
Chloride: 105 mmol/L (ref 96–106)
Creatinine, Ser: 0.77 mg/dL (ref 0.57–1.00)
Globulin, Total: 2.4 g/dL (ref 1.5–4.5)
Glucose: 82 mg/dL (ref 70–99)
Potassium: 4.2 mmol/L (ref 3.5–5.2)
Sodium: 141 mmol/L (ref 134–144)
Total Protein: 6.9 g/dL (ref 6.0–8.5)
eGFR: 87 mL/min/{1.73_m2} (ref >59)

## 2021-04-24 LAB — ANEMIA PROFILE B
Basophils Absolute: 0.1 10*3/uL (ref 0.0–0.2)
Basos: 1 %
EOS (ABSOLUTE): 0 10*3/uL (ref 0.0–0.4)
Eos: 0 %
Ferritin: 444 ng/mL — ABNORMAL HIGH (ref 15–150)
Folate: 15.7 ng/mL (ref >3.0)
Hematocrit: 38.2 % (ref 34.0–46.6)
Hemoglobin: 13.1 g/dL (ref 11.1–15.9)
Immature Grans (Abs): 0 10*3/uL (ref 0.0–0.1)
Immature Granulocytes: 0 %
Iron Saturation: 28 % (ref 15–55)
Iron: 80 ug/dL (ref 27–139)
Lymphocytes Absolute: 2.4 10*3/uL (ref 0.7–3.1)
Lymphs: 31 %
MCH: 29.2 pg (ref 26.6–33.0)
MCHC: 34.3 g/dL (ref 31.5–35.7)
MCV: 85 fL (ref 79–97)
Monocytes Absolute: 0.4 10*3/uL (ref 0.1–0.9)
Monocytes: 5 %
Neutrophils Absolute: 4.9 10*3/uL (ref 1.4–7.0)
Neutrophils: 63 %
Platelets: 233 10*3/uL (ref 150–450)
RBC: 4.48 x10E6/uL (ref 3.77–5.28)
RDW: 13.6 % (ref 11.7–15.4)
Retic Ct Pct: 1.7 % (ref 0.6–2.6)
Total Iron Binding Capacity: 285 ug/dL (ref 250–450)
UIBC: 205 ug/dL (ref 118–369)
Vitamin B-12: 1157 pg/mL (ref 232–1245)
WBC: 7.8 10*3/uL (ref 3.4–10.8)

## 2021-04-24 LAB — LIPID PANEL
Chol/HDL Ratio: 3.7 ratio (ref 0.0–4.4)
Cholesterol, Total: 175 mg/dL (ref 100–199)
HDL: 47 mg/dL (ref >39)
LDL Chol Calc (NIH): 108 mg/dL — ABNORMAL HIGH (ref 0–99)
Triglycerides: 108 mg/dL (ref 0–149)
VLDL Cholesterol Cal: 20 mg/dL (ref 5–40)

## 2021-04-24 LAB — VITAMIN D 25 HYDROXY (VIT D DEFICIENCY, FRACTURES): Vit D, 25-Hydroxy: 57 ng/mL (ref 30.0–100.0)

## 2021-04-28 ENCOUNTER — Encounter: Payer: Self-pay | Admitting: Internal Medicine

## 2021-05-05 ENCOUNTER — Other Ambulatory Visit: Payer: Self-pay | Admitting: Family Medicine

## 2021-05-05 DIAGNOSIS — Z720 Tobacco use: Secondary | ICD-10-CM

## 2021-05-07 ENCOUNTER — Encounter: Payer: Self-pay | Admitting: Internal Medicine

## 2021-05-07 ENCOUNTER — Other Ambulatory Visit: Payer: Self-pay

## 2021-05-07 ENCOUNTER — Ambulatory Visit: Payer: 59 | Admitting: Internal Medicine

## 2021-05-07 VITALS — BP 110/78 | HR 88 | Temp 96.9°F | Ht 67.0 in | Wt 181.6 lb

## 2021-05-07 DIAGNOSIS — K921 Melena: Secondary | ICD-10-CM

## 2021-05-07 DIAGNOSIS — R198 Other specified symptoms and signs involving the digestive system and abdomen: Secondary | ICD-10-CM

## 2021-05-07 MED ORDER — PEG 3350-KCL-NA BICARB-NACL 420 G PO SOLR: 4000.0000 mL | ORAL | 0 refills | Status: DC

## 2021-05-07 NOTE — Patient Instructions (Signed)
It was good seeing you again today! ? ?As discussed, we will schedule a diagnostic colonoscopy (rectal bleeding) ASA 2 ? ?I agree with Delight Ovens, you may stop your iron supplement as you are blood work has been repeatedly normal.  However, you should follow-up with Delight Ovens as recommended to have lab work repeated in the future. ? ?Further recommendations to follow after colonoscopy has been completed. ? ? ?

## 2021-05-07 NOTE — Progress Notes (Signed)
? ? ?Primary Care Physician:  Loman Brooklyn, FNP ?Primary Gastroenterologist:  Dr. Gala Romney ? ?Pre-Procedure History & Physical: ?HPI:  Monica Elliott is a 61 y.o. female here for for further evaluation of paper hematochezia and consideration of updating colonoscopy.  Colonoscopy 10 years ago demonstrated pancolonic diverticulosis.  Patient notes for the last couple of months she has seen blood on the toilet tissue.  May go upwards of 5 times daily.  Sometimes after she eats or during a meal she has to call to stool.  Sometimes pain relieved after having a BM but other times not.  Has never seen toilet bowl water.  Has not had gross hematuria just on the paper when she wipes.  Does have some abdominal cramping around BM time.  None at other times.  Denies any upper GI tract symptoms such as odynophagia, dysphagia, early satiety, nausea, vomiting or weight loss.  Appetite is well-maintained. ?No family history of colon cancer or polyps. ?Patient reported to have a history of iron deficiency anemia has been on iron supplementation for some time.  I reviewed the medical record from last fall in just last month her CBC is normal with normal iron parameters.  It appears her PCP recommended that she could stop her iron supplementation. ?Past Medical History:  ?Diagnosis Date  ? Anemia   ? states low iron  ? Depression   ? Hypercholesterolemia   ? Hypertension   ? Vitamin D deficiency   ? ? ?Past Surgical History:  ?Procedure Laterality Date  ? BACK SURGERY  2016  ? BREAST BIOPSY    ? COLONOSCOPY  04/06/2011  ? Colonic diverticulosis and colonic polyps-removed as described above. Polyps benign. Next colonoscopy 04/2021.  ? TUBAL LIGATION    ? ? ?Prior to Admission medications   ?Medication Sig Start Date End Date Taking? Authorizing Provider  ?atorvastatin (LIPITOR) 40 MG tablet Take 1 tablet (40 mg total) by mouth daily. 04/23/21  Yes Loman Brooklyn, FNP  ?Cholecalciferol (VITAMIN D3) 10 MCG (400 UNIT) CAPS Take 10 mcg  by mouth daily.   Yes [provider]  ?ferrous sulfate 325 (65 FE) MG tablet Take 325 mg by mouth daily. 01/20/16  Yes [provider]  ?gabapentin (NEURONTIN) 300 MG capsule Take 1 capsule by mouth at bedtime. 07/15/20  Yes [provider]  ?hydrocortisone (ANUSOL-HC) 2.5 % rectal cream Place 1 application. rectally 2 (two) times daily. 04/23/21  Yes Loman Brooklyn, FNP  ?lisinopril (ZESTRIL) 20 MG tablet Take 1 tablet (20 mg total) by mouth daily. 04/23/21  Yes Loman Brooklyn, FNP  ?Multiple Vitamin (MULTIVITAMIN) capsule Take 1 capsule by mouth daily.   Yes [provider]  ?Omega-3 Fatty Acids (FISH OIL) 1200 MG CAPS Take 1,200 mg by mouth daily.   Yes [provider]  ?Turmeric (QC TUMERIC COMPLEX PO) Take 1 capsule by mouth daily.   Yes [provider]  ?vitamin C (ASCORBIC ACID) 500 MG tablet Take 500 mg by mouth daily.   Yes [provider]  ? ? ?Allergies as of 05/07/2021  ? (No Known Allergies)  ? ? ?Family History  ?Problem Relation Age of Onset  ? Heart disease Mother   ? Hypertension Mother   ? Diabetes Mother   ? Hypertension Father   ? Alcohol abuse Brother   ? Diabetes Sister   ? Colon cancer Neg Hx   ? ? ?Social History  ? ?Socioeconomic History  ? Marital status: Single  ?  Spouse name: Not on file  ? Number of children: Not on file  ? Years of education: Not on file  ? Highest education level: Not on file  ?Occupational History  ? Not on file  ?Tobacco Use  ? Smoking status: Every Day  ?  Packs/day: 0.75  ?  Types: Cigarettes  ? Smokeless tobacco: Never  ?Vaping Use  ? Vaping Use: Never used  ?Substance and Sexual Activity  ? Alcohol use: Yes  ?  Comment: social  ? Drug use: No  ? Sexual activity: Not Currently  ?Other Topics Concern  ? Not on file  ?Social History Narrative  ? Not on file  ? ?Social Determinants of Health  ? ?Financial Resource Strain: Not on file  ?Food Insecurity: Not on file  ?Transportation Needs: Not on file   ?Physical Activity: Not on file  ?Stress: Not on file  ?Social Connections: Not on file  ?Intimate Partner Violence: Not on file  ? ? ?Review of Systems: ?See HPI, otherwise negative ROS ? ?Physical Exam: ?BP 110/78 (BP Location: Left Arm, Patient Position: Sitting, Cuff Size: Normal)   Pulse 88   Temp (!) 96.9 ?F (36.1 ?C) (Temporal)   Ht '5\' 7"'$  (1.702 m)   Wt 181 lb 9.6 oz (82.4 kg)   LMP 05/10/2011   SpO2 99%   BMI 28.44 kg/m?  ?General:   Alert,  Well-developed, well-nourished, pleasant and cooperative in NAD ?Mouth:  No deformity or lesions. ?Neck:  Supple; no masses or thyromegaly. No significant cervical adenopathy. ?Lungs:  Clear throughout to auscultation.   No wheezes, crackles, or rhonchi. No acute distress. ?Heart:  Regular rate and rhythm; no murmurs, clicks, rubs,  or gallops. ?Abdomen: Non-distended, normal bowel sounds.  Soft and nontender without appreciable mass or hepatosplenomegaly.  ? ?Impression/Plan: 61 year old lady with intermittent paper hematochezia.  Intermittent abdominal cramps and bowel function temporally related to eating a meal.  She is not really describing out and out diarrhea.  Certainly, no constipation. ?Clinically, sounds like IBS excluding the rectal bleeding.  Rectal bleeding likely anorectal in origin. ?Further evaluation is warranted; she is due for screening colonoscopy anyway. ?Recent serial CBCs and iron studies look good. ? ?Recommendations: ? ?I have offered the patient a diagnostic colonoscopy today.The risks, benefits, limitations, alternatives and imponderables have been reviewed with the patient. Questions have been answered. All parties are agreeable.   ?ASA 2. ?From a GI standpoint, I recommend that her iron supplementation be stopped. ? ?Further recommendations to follow pending findings of the examination. ? ? ? ? ? ?Notice: This dictation was prepared with Dragon dictation along with smaller phrase technology. Any transcriptional errors that result  from this process are unintentional and may not be corrected upon review.  ?

## 2021-05-07 NOTE — H&P (View-Only) (Signed)
? ? ?Primary Care Physician:  Loman Brooklyn, FNP ?Primary Gastroenterologist:  Dr. Gala Romney ? ?Pre-Procedure History & Physical: ?HPI:  Monica Elliott is a 61 y.o. female here for for further evaluation of paper hematochezia and consideration of updating colonoscopy.  Colonoscopy 10 years ago demonstrated pancolonic diverticulosis.  Patient notes for the last couple of months she has seen blood on the toilet tissue.  May go upwards of 5 times daily.  Sometimes after she eats or during a meal she has to call to stool.  Sometimes pain relieved after having a BM but other times not.  Has never seen toilet bowl water.  Has not had gross hematuria just on the paper when she wipes.  Does have some abdominal cramping around BM time.  None at other times.  Denies any upper GI tract symptoms such as odynophagia, dysphagia, early satiety, nausea, vomiting or weight loss.  Appetite is well-maintained. ?No family history of colon cancer or polyps. ?Patient reported to have a history of iron deficiency anemia has been on iron supplementation for some time.  I reviewed the medical record from last fall in just last month her CBC is normal with normal iron parameters.  It appears her PCP recommended that she could stop her iron supplementation. ?Past Medical History:  ?Diagnosis Date  ? Anemia   ? states low iron  ? Depression   ? Hypercholesterolemia   ? Hypertension   ? Vitamin D deficiency   ? ? ?Past Surgical History:  ?Procedure Laterality Date  ? BACK SURGERY  2016  ? BREAST BIOPSY    ? COLONOSCOPY  04/06/2011  ? Colonic diverticulosis and colonic polyps-removed as described above. Polyps benign. Next colonoscopy 04/2021.  ? TUBAL LIGATION    ? ? ?Prior to Admission medications   ?Medication Sig Start Date End Date Taking? Authorizing Provider  ?atorvastatin (LIPITOR) 40 MG tablet Take 1 tablet (40 mg total) by mouth daily. 04/23/21  Yes Loman Brooklyn, FNP  ?Cholecalciferol (VITAMIN D3) 10 MCG (400 UNIT) CAPS Take 10 mcg  by mouth daily.   Yes [provider]  ?ferrous sulfate 325 (65 FE) MG tablet Take 325 mg by mouth daily. 01/20/16  Yes [provider]  ?gabapentin (NEURONTIN) 300 MG capsule Take 1 capsule by mouth at bedtime. 07/15/20  Yes [provider]  ?hydrocortisone (ANUSOL-HC) 2.5 % rectal cream Place 1 application. rectally 2 (two) times daily. 04/23/21  Yes Loman Brooklyn, FNP  ?lisinopril (ZESTRIL) 20 MG tablet Take 1 tablet (20 mg total) by mouth daily. 04/23/21  Yes Loman Brooklyn, FNP  ?Multiple Vitamin (MULTIVITAMIN) capsule Take 1 capsule by mouth daily.   Yes [provider]  ?Omega-3 Fatty Acids (FISH OIL) 1200 MG CAPS Take 1,200 mg by mouth daily.   Yes [provider]  ?Turmeric (QC TUMERIC COMPLEX PO) Take 1 capsule by mouth daily.   Yes [provider]  ?vitamin C (ASCORBIC ACID) 500 MG tablet Take 500 mg by mouth daily.   Yes [provider]  ? ? ?Allergies as of 05/07/2021  ? (No Known Allergies)  ? ? ?Family History  ?Problem Relation Age of Onset  ? Heart disease Mother   ? Hypertension Mother   ? Diabetes Mother   ? Hypertension Father   ? Alcohol abuse Brother   ? Diabetes Sister   ? Colon cancer Neg Hx   ? ? ?Social History  ? ?Socioeconomic History  ? Marital status: Single  ?  Spouse name: Not on file  ? Number of children: Not on file  ? Years of education: Not on file  ? Highest education level: Not on file  ?Occupational History  ? Not on file  ?Tobacco Use  ? Smoking status: Every Day  ?  Packs/day: 0.75  ?  Types: Cigarettes  ? Smokeless tobacco: Never  ?Vaping Use  ? Vaping Use: Never used  ?Substance and Sexual Activity  ? Alcohol use: Yes  ?  Comment: social  ? Drug use: No  ? Sexual activity: Not Currently  ?Other Topics Concern  ? Not on file  ?Social History Narrative  ? Not on file  ? ?Social Determinants of Health  ? ?Financial Resource Strain: Not on file  ?Food Insecurity: Not on file  ?Transportation Needs: Not on file   ?Physical Activity: Not on file  ?Stress: Not on file  ?Social Connections: Not on file  ?Intimate Partner Violence: Not on file  ? ? ?Review of Systems: ?See HPI, otherwise negative ROS ? ?Physical Exam: ?BP 110/78 (BP Location: Left Arm, Patient Position: Sitting, Cuff Size: Normal)   Pulse 88   Temp (!) 96.9 ?F (36.1 ?C) (Temporal)   Ht '5\' 7"'$  (1.702 m)   Wt 181 lb 9.6 oz (82.4 kg)   LMP 05/10/2011   SpO2 99%   BMI 28.44 kg/m?  ?General:   Alert,  Well-developed, well-nourished, pleasant and cooperative in NAD ?Mouth:  No deformity or lesions. ?Neck:  Supple; no masses or thyromegaly. No significant cervical adenopathy. ?Lungs:  Clear throughout to auscultation.   No wheezes, crackles, or rhonchi. No acute distress. ?Heart:  Regular rate and rhythm; no murmurs, clicks, rubs,  or gallops. ?Abdomen: Non-distended, normal bowel sounds.  Soft and nontender without appreciable mass or hepatosplenomegaly.  ? ?Impression/Plan: 61 year old lady with intermittent paper hematochezia.  Intermittent abdominal cramps and bowel function temporally related to eating a meal.  She is not really describing out and out diarrhea.  Certainly, no constipation. ?Clinically, sounds like IBS excluding the rectal bleeding.  Rectal bleeding likely anorectal in origin. ?Further evaluation is warranted; she is due for screening colonoscopy anyway. ?Recent serial CBCs and iron studies look good. ? ?Recommendations: ? ?I have offered the patient a diagnostic colonoscopy today.The risks, benefits, limitations, alternatives and imponderables have been reviewed with the patient. Questions have been answered. All parties are agreeable.   ?ASA 2. ?From a GI standpoint, I recommend that her iron supplementation be stopped. ? ?Further recommendations to follow pending findings of the examination. ? ? ? ? ? ?Notice: This dictation was prepared with Dragon dictation along with smaller phrase technology. Any transcriptional errors that result  from this process are unintentional and may not be corrected upon review.  ?

## 2021-05-27 ENCOUNTER — Telehealth: Payer: Self-pay | Admitting: Internal Medicine

## 2021-05-27 NOTE — Telephone Encounter (Signed)
Called pt, informed her she will not need appt with pre-op nurse but to expect some calls prior to procedure to discuss things like her meds. ?

## 2021-05-27 NOTE — Telephone Encounter (Signed)
Patient called asking if they were going to do a pre op appointment on her?  ?

## 2021-06-04 ENCOUNTER — Encounter (HOSPITAL_COMMUNITY)
Admission: RE | Disposition: A | Discharge status: Home / Self Care | Payer: Self-pay | Source: Home / Self Care | Attending: Internal Medicine

## 2021-06-04 ENCOUNTER — Other Ambulatory Visit: Payer: Self-pay

## 2021-06-04 ENCOUNTER — Encounter (HOSPITAL_COMMUNITY): Payer: Self-pay | Admitting: Internal Medicine

## 2021-06-04 ENCOUNTER — Ambulatory Visit (HOSPITAL_COMMUNITY)
Admission: RE | Admit: 2021-06-04 | Discharge: 2021-06-04 | Disposition: A | Discharge status: Home / Self Care | Payer: 59 | Attending: Internal Medicine | Admitting: Internal Medicine

## 2021-06-04 ENCOUNTER — Ambulatory Visit (HOSPITAL_COMMUNITY): Payer: 59 | Admitting: Anesthesiology

## 2021-06-04 ENCOUNTER — Ambulatory Visit (HOSPITAL_BASED_OUTPATIENT_CLINIC_OR_DEPARTMENT_OTHER): Payer: 59 | Admitting: Anesthesiology

## 2021-06-04 DIAGNOSIS — D509 Iron deficiency anemia, unspecified: Secondary | ICD-10-CM | POA: Diagnosis not present

## 2021-06-04 DIAGNOSIS — K635 Polyp of colon: Secondary | ICD-10-CM | POA: Diagnosis not present

## 2021-06-04 DIAGNOSIS — K641 Second degree hemorrhoids: Secondary | ICD-10-CM

## 2021-06-04 DIAGNOSIS — K5730 Diverticulosis of large intestine without perforation or abscess without bleeding: Secondary | ICD-10-CM | POA: Diagnosis not present

## 2021-06-04 DIAGNOSIS — F17210 Nicotine dependence, cigarettes, uncomplicated: Secondary | ICD-10-CM | POA: Diagnosis not present

## 2021-06-04 DIAGNOSIS — I10 Essential (primary) hypertension: Secondary | ICD-10-CM | POA: Insufficient documentation

## 2021-06-04 DIAGNOSIS — K5731 Diverticulosis of large intestine without perforation or abscess with bleeding: Secondary | ICD-10-CM

## 2021-06-04 DIAGNOSIS — K921 Melena: Secondary | ICD-10-CM | POA: Insufficient documentation

## 2021-06-04 HISTORY — PX: COLONOSCOPY WITH PROPOFOL: SHX5780

## 2021-06-04 HISTORY — PX: POLYPECTOMY: SHX5525

## 2021-06-04 SURGERY — COLONOSCOPY WITH PROPOFOL
Anesthesia: General

## 2021-06-04 MED ORDER — PROPOFOL 10 MG/ML IV BOLUS
INTRAVENOUS | Status: DC | PRN
  Administered 2021-06-04: 100 mg via INTRAVENOUS
  Administered 2021-06-04: 50 mg via INTRAVENOUS

## 2021-06-04 MED ORDER — PROPOFOL 500 MG/50ML IV EMUL
INTRAVENOUS | Status: DC | PRN
  Administered 2021-06-04: 150 ug/kg/min via INTRAVENOUS

## 2021-06-04 MED ORDER — LIDOCAINE HCL (CARDIAC) PF 100 MG/5ML IV SOSY
PREFILLED_SYRINGE | INTRAVENOUS | Status: DC | PRN
  Administered 2021-06-04: 50 mg via INTRAVENOUS

## 2021-06-04 MED ORDER — LACTATED RINGERS IV SOLN
INTRAVENOUS | Status: DC
  Administered 2021-06-04: 1000 mL via INTRAVENOUS

## 2021-06-04 NOTE — Anesthesia Postprocedure Evaluation (Signed)
Anesthesia Post Note ? ?Patient: Monica Elliott ? ?Procedure(s) Performed: COLONOSCOPY WITH PROPOFOL ?POLYPECTOMY ? ?Patient location during evaluation: Endoscopy ?Anesthesia Type: General ?Level of consciousness: awake and alert and oriented ?Pain management: pain level controlled ?Vital Signs Assessment: post-procedure vital signs reviewed and stable ?Respiratory status: spontaneous breathing, nonlabored ventilation and respiratory function stable ?Cardiovascular status: blood pressure returned to baseline and stable ?Postop Assessment: no apparent nausea or vomiting ?Anesthetic complications: no ? ? ?No notable events documented. ? ? ?Last Vitals:  ?Vitals:  ? 06/04/21 0836 06/04/21 0918  ?BP: 130/89 110/77  ?Pulse: 91 79  ?Resp: 13 18  ?Temp: 36.5 ?C 36.4 ?C  ?SpO2: 99% 95%  ?  ?Last Pain:  ?Vitals:  ? 06/04/21 0918  ?TempSrc: Oral  ?PainSc: 0-No pain  ? ? ?  ?  ?  ?  ?  ?  ? ?Shacoria Latif C Cliford Sequeira ? ? ? ? ?

## 2021-06-04 NOTE — Discharge Instructions (Signed)
?  Colonoscopy ?Discharge Instructions ? ?Read the instructions outlined below and refer to this sheet in the next few weeks. These discharge instructions provide you with general information on caring for yourself after you leave the hospital. Your doctor may also give you specific instructions. While your treatment has been planned according to the most current medical practices available, unavoidable complications occasionally occur. If you have any problems or questions after discharge, call Dr. Gala Romney at [phone]. ?ACTIVITY ?You may resume your regular activity, but move at a slower pace for the next 24 hours.  ?Take frequent rest periods for the next 24 hours.  ?Walking will help get rid of the air and reduce the bloated feeling in your belly (abdomen).  ?No driving for 24 hours (because of the medicine (anesthesia) used during the test).   ?Do not sign any important legal documents or operate any machinery for 24 hours (because of the anesthesia used during the test).  ?NUTRITION ?Drink plenty of fluids.  ?You may resume your normal diet as instructed by your doctor.  ?Begin with a light meal and progress to your normal diet. Heavy or fried foods are harder to digest and may make you feel sick to your stomach (nauseated).  ?Avoid alcoholic beverages for 24 hours or as instructed.  ?MEDICATIONS ?You may resume your normal medications unless your doctor tells you otherwise.  ?WHAT YOU CAN EXPECT TODAY ?Some feelings of bloating in the abdomen.  ?Passage of more gas than usual.  ?Spotting of blood in your stool or on the toilet paper.  ?IF YOU HAD POLYPS REMOVED DURING THE COLONOSCOPY: ?No aspirin products for 7 days or as instructed.  ?No alcohol for 7 days or as instructed.  ?Eat a soft diet for the next 24 hours.  ?FINDING OUT THE RESULTS OF YOUR TEST ?Not all test results are available during your visit. If your test results are not back during the visit, make an appointment with your caregiver to find out the  results. Do not assume everything is normal if you have not heard from your caregiver or the medical facility. It is important for you to follow up on all of your test results.  ?SEEK IMMEDIATE MEDICAL ATTENTION IF: ?You have more than a spotting of blood in your stool.  ?Your belly is swollen (abdominal distention).  ?You are nauseated or vomiting.  ?You have a temperature over 101.  ?You have abdominal pain or discomfort that is severe or gets worse throughout the day.   ? ? ?You have diverticulosis ? ? 1 polyp removed from your colon ? ? bleeding from hemorrhoids. ? ?  You may be a good candidate for an office hemorrhoid banding. ? ?Colon polyp, diverticulosis and hemorrhoid information, hemorrhoid banding pamphlet provided ? ? further recommendations to follow pending review of pathology report ? ? office visit with Roseanne Kaufman in 4 to 6 weeks ? ? at patient request, I called Alexis Frock  at [phone]-  reviewed findings and recommendations ?

## 2021-06-04 NOTE — Anesthesia Preprocedure Evaluation (Signed)
Anesthesia Evaluation  ?Patient identified by MRN, date of birth, ID band ?Patient awake ? ? ? ?Reviewed: ?Allergy & Precautions, NPO status , Patient's Chart, lab work & pertinent test results ? ?Airway ?Mallampati: II ? ?TM Distance: >3 FB ?Neck ROM: Full ? ? ? Dental ? ?(+) Dental Advisory Given, Upper Dentures, Lower Dentures ?  ?Pulmonary ?Current Smoker,  ?  ?Pulmonary exam normal ?breath sounds clear to auscultation ? ? ? ? ? ? Cardiovascular ?Exercise Tolerance: Good ?hypertension, Pt. on medications ?Normal cardiovascular exam ?Rhythm:Regular Rate:Normal ? ? ?  ?Neuro/Psych ?PSYCHIATRIC DISORDERS Anxiety Depression  Neuromuscular disease (right sided sciatica)   ? GI/Hepatic ?negative GI ROS, Neg liver ROS,   ?Endo/Other  ?negative endocrine ROS ? Renal/GU ?negative Renal ROS  ?negative genitourinary ?  ?Musculoskeletal ? ?(+) Arthritis ,  ? Abdominal ?  ?Peds ?negative pediatric ROS ?(+)  Hematology ? ?(+) Blood dyscrasia, anemia ,   ?Anesthesia Other Findings ? ? Reproductive/Obstetrics ?negative OB ROS ? ?  ? ? ? ? ? ? ? ? ? ? ? ? ? ?  ?  ? ? ? ? ? ? ? ? ?Anesthesia Physical ?Anesthesia Plan ? ?ASA: 2 ? ?Anesthesia Plan: General  ? ?Post-op Pain Management: Minimal or no pain anticipated  ? ?Induction: Intravenous ? ?PONV Risk Score and Plan:  ? ?Airway Management Planned: Nasal Cannula and Natural Airway ? ?Additional Equipment:  ? ?Intra-op Plan:  ? ?Post-operative Plan:  ? ?Informed Consent: I have reviewed the patients History and Physical, chart, labs and discussed the procedure including the risks, benefits and alternatives for the proposed anesthesia with the patient or authorized representative who has indicated his/her understanding and acceptance.  ? ? ? ? ? ?Plan Discussed with: CRNA and Surgeon ? ?Anesthesia Plan Comments:   ? ? ? ? ? ? ?Anesthesia Quick Evaluation ? ?

## 2021-06-04 NOTE — Op Note (Signed)
[facility] ?Patient Name: Monica Elliott ?Procedure Date: 06/04/2021 8:39 AM ?MRN: [id number] ?Date of Birth: [birth year] ?Attending MD: Norvel Richards , MD ?CSN: [id number] ?Age: 61 ?Admit Type: Outpatient ?Procedure:                Colonoscopy ?Indications:              Hematochezia ?Providers:                Norvel Richards, MD, Janeece Riggers, RN, Ulice Dash  ?                          Blima Singer, Technician ?Referring MD:              ?Medicines:                Propofol per Anesthesia ?Complications:            No immediate complications. ?Estimated Blood Loss:     Estimated blood loss was minimal. ?Procedure:                Pre-Anesthesia Assessment: ?                          - Prior to the procedure, a History and Physical  ?                          was performed, and patient medications and  ?                          allergies were reviewed. The patient's tolerance of  ?                          previous anesthesia was also reviewed. The risks  ?                          and benefits of the procedure and the sedation  ?                          options and risks were discussed with the patient.  ?                          All questions were answered, and informed consent  ?                          was obtained. Prior Anticoagulants: The patient has  ?                          taken no previous anticoagulant or antiplatelet  ?                          agents. ASA Grade Assessment: II - A patient with  ?                          mild systemic disease. After reviewing the risks  ?  and benefits, the patient was deemed in  ?                          satisfactory condition to undergo the procedure. ?                          After obtaining informed consent, the colonoscope  ?                          was passed under direct vision. Throughout the  ?                          procedure, the patient's blood pressure, pulse, and  ?                          oxygen saturations were  monitored continuously. The  ?                          [id number]) scope was introduced through the  ?                          anus and advanced to the the cecum, identified by  ?                          appendiceal orifice and ileocecal valve. The  ?                          colonoscopy was performed without difficulty. The  ?                          patient tolerated the procedure well. The quality  ?                          of the bowel preparation was adequate. ?Scope In: 9:03:30 AM ?Scope Out: 9:14:54 AM ?Scope Withdrawal Time: 0 hours 6 minutes 58 seconds  ?Total Procedure Duration: 0 hours 11 minutes 24 seconds  ?Findings: ?     The perianal and digital rectal examinations were normal. ?     Many medium-mouthed diverticula were found in the entire colon. ?     A 5 mm polyp was found in the splenic flexure. The polyp was sessile.  ?     The polyp was removed with a cold snare. Resection and retrieval were  ?     complete. Estimated blood loss was minimal. ?     Non-bleeding internal hemorrhoids were found during retroflexion. The  ?     hemorrhoids were moderate, medium-sized and Grade II (internal  ?     hemorrhoids that prolapse but reduce spontaneously). ?     The exam was otherwise without abnormality on direct and retroflexion  ?     views. ?Impression:               - Diverticulosis in the entire examined colon. ?                          - One 5 mm polyp at the splenic flexure, removed  ?  with a cold snare. Resected and retrieved. ?                          - Non-bleeding internal hemorrhoids. ?                          - The examination was otherwise normal on direct  ?                          and retroflexion views. Patient likely bleeding  ?                          from hemorrhoids. Would be a reasonably good  ?                          hemorrhoid banding candidate. ?Moderate Sedation: ?     Moderate (conscious) sedation was personally administered by an  ?      anesthesia professional. The following parameters were monitored: oxygen  ?     saturation, heart rate, blood pressure, respiratory rate, EKG, adequacy  ?     of pulmonary ventilation, and response to care. ?Recommendation:           - Patient has a contact number available for  ?                          emergencies. The signs and symptoms of potential  ?                          delayed complications were discussed with the  ?                          patient. Return to normal activities tomorrow.  ?                          Written discharge instructions were provided to the  ?                          patient. ?                          - Advance diet as tolerated. ?                          - Continue present medications. Begin Benefiber as  ?                          directed. Pamphlet on hemorrhoid banding provided.  ?                          Follow-up on pathology. ?                          - Return to my office in 2 months. ?Procedure Code(s):        --- Professional --- ?  45385, Colonoscopy, flexible; with removal of  ?                          tumor(s), polyp(s), or other lesion(s) by snare  ?                          technique ?Diagnosis Code(s):        --- Professional --- ?                          K64.1, Second degree hemorrhoids ?                          K63.5, Polyp of colon ?                          K92.1, Melena (includes Hematochezia) ?                          K57.30, Diverticulosis of large intestine without  ?                          perforation or abscess without bleeding ?CPT copyright 2019 American Medical Association. All rights reserved. ?The codes documented in this report are preliminary and upon coder review may  ?be revised to meet current compliance requirements. ?Cristopher Estimable. Lenise Jr, MD ?Norvel Richards, MD ?06/04/2021 9:19:10 AM ?This report has been signed electronically. ?Number of Addenda: 0 ?

## 2021-06-04 NOTE — Transfer of Care (Signed)
Immediate Anesthesia Transfer of Care Note ? ?Patient: Xareni Kelch Sima ? ?Procedure(s) Performed: COLONOSCOPY WITH PROPOFOL ?POLYPECTOMY ? ?Patient Location: Endoscopy Unit ? ?Anesthesia Type:General ? ?Level of Consciousness: awake ? ?Airway & Oxygen Therapy: Patient Spontanous Breathing ? ?Post-op Assessment: Report given to RN and Post -op Vital signs reviewed and stable ? ?Post vital signs: Reviewed and stable ? ?Last Vitals:  ?Vitals Value Taken Time  ?BP    ?Temp    ?Pulse    ?Resp    ?SpO2    ? ? ?Last Pain:  ?Vitals:  ? 06/04/21 0841  ?TempSrc:   ?PainSc: 0-No pain  ?   ? ?Patients Stated Pain Goal: 9 (06/04/21 8875) ? ?Complications: No notable events documented. ?

## 2021-06-04 NOTE — Interval H&P Note (Signed)
History and Physical Interval Note: ? ?06/04/2021 ?8:47 AM ? ?Monica Elliott  has presented today for surgery, with the diagnosis of hematochezia.  The various methods of treatment have been discussed with the patient and family. After consideration of risks, benefits and other options for treatment, the patient has consented to  Procedure(s) with comments: ?COLONOSCOPY WITH PROPOFOL (N/A) - 9:45am as a surgical intervention.  The patient's history has been reviewed, patient examined, no change in status, stable for surgery.  I have reviewed the patient's chart and labs.  Questions were answered to the patient's satisfaction.   ? ? ?[name] ? ? no change.  Diagnostic colonoscopy today per plan. ? ?The risks, benefits, limitations, alternatives and imponderables have been reviewed with the patient. Questions have been answered. All parties are agreeable.   ?

## 2021-06-04 NOTE — Anesthesia Procedure Notes (Signed)
Date/Time: 06/04/2021 9:00 AM ?Performed by: Orlie Dakin, CRNA ?Pre-anesthesia Checklist: Patient identified, Emergency Drugs available, Suction available and Patient being monitored ?Patient Re-evaluated:Patient Re-evaluated prior to induction ?Oxygen Delivery Method: Nasal cannula ?Induction Type: IV induction ?Placement Confirmation: positive ETCO2 ? ? ? ? ?

## 2021-06-05 ENCOUNTER — Encounter: Payer: Self-pay | Admitting: Internal Medicine

## 2021-06-05 LAB — SURGICAL PATHOLOGY

## 2021-06-12 ENCOUNTER — Encounter (HOSPITAL_COMMUNITY): Payer: Self-pay | Admitting: Internal Medicine

## 2021-06-24 ENCOUNTER — Encounter: Payer: Self-pay | Admitting: Family Medicine

## 2021-06-24 ENCOUNTER — Ambulatory Visit (INDEPENDENT_AMBULATORY_CARE_PROVIDER_SITE_OTHER): Payer: 59 | Admitting: Family Medicine

## 2021-06-24 DIAGNOSIS — J069 Acute upper respiratory infection, unspecified: Secondary | ICD-10-CM | POA: Diagnosis not present

## 2021-06-24 DIAGNOSIS — R062 Wheezing: Secondary | ICD-10-CM

## 2021-06-24 MED ORDER — PREDNISONE 20 MG PO TABS
40.0000 mg | ORAL_TABLET | Freq: Every day | ORAL | 0 refills | Status: AC
Start: 2021-06-24 — End: 2021-06-29

## 2021-06-24 MED ORDER — FLUTICASONE PROPIONATE 50 MCG/ACT NA SUSP: 2.0000 | Freq: Every day | NASAL | 6 refills | Status: DC

## 2021-06-24 MED ORDER — AMOXICILLIN-POT CLAVULANATE 875-125 MG PO TABS: 1.0000 | ORAL_TABLET | Freq: Two times a day (BID) | ORAL | 0 refills | Status: AC

## 2021-06-24 NOTE — Progress Notes (Signed)
Virtual Visit via telephone Note Due to COVID-19 pandemic this visit was conducted virtually. This visit type was conducted due to national recommendations for restrictions regarding the COVID-19 Pandemic (e.g. social distancing, sheltering in place) in an effort to limit this patient's exposure and mitigate transmission in our community. All issues noted in this document were discussed and addressed.  A physical exam was not performed with this format.   I connected with Monica Elliott on 06/24/2021 at 0905 by telephone and verified that I am speaking with the correct person using two identifiers. Monica Elliott is currently located at home and patient is currently with them during visit. The provider, Monia Pouch, FNP is located in their office at time of visit.  I discussed the limitations, risks, security and privacy concerns of performing an evaluation and management service by virtual visit and the availability of in person appointments. I also discussed with the patient that there may be a patient responsible charge related to this service. The patient expressed understanding and agreed to proceed.  Subjective:  Patient ID: Monica Elliott, female    DOB: 1960-08-17, 61 y.o.   MRN: [id number]  Chief Complaint:  Sinusitis   HPI: Monica Elliott is a 61 y.o. female presenting on 06/24/2021 for Sinusitis   Pt reports ongoing URI symptoms which have worsened over the last 3 days. She now has significant cough with wheezing along with other symptoms. She has been taking antihistamine and cough suppressant without relief of symptoms.   Sinusitis This is a new problem. The current episode started 1 to 4 weeks ago. The problem has been gradually worsening since onset. Associated symptoms include congestion, coughing, ear pain, headaches, sinus pressure and sneezing. Pertinent negatives include no chills, diaphoresis, hoarse voice, neck pain, shortness of breath, sore throat or swollen  glands. Past treatments include oral decongestants. The treatment provided no relief.  URI  This is a new problem. The current episode started 1 to 4 weeks ago. The problem has been gradually worsening. Associated symptoms include congestion, coughing, ear pain, headaches, a plugged ear sensation, rhinorrhea, sinus pain, sneezing and wheezing. Pertinent negatives include no abdominal pain, chest pain, diarrhea, dysuria, joint pain, joint swelling, nausea, neck pain, rash, sore throat, swollen glands or vomiting. She has tried antihistamine for the symptoms. The treatment provided no relief.    Relevant past medical, surgical, family, and social history reviewed and updated as indicated.  Allergies and medications reviewed and updated.   Past Medical History:  Diagnosis Date   Anemia    states low iron   Depression    Hypercholesterolemia    Hypertension    Vitamin D deficiency     Past Surgical History:  Procedure Laterality Date   BACK SURGERY  2016   BREAST BIOPSY     COLONOSCOPY  04/06/2011   Colonic diverticulosis and colonic polyps-removed as described above. Polyps benign. Next colonoscopy 04/2021.   COLONOSCOPY WITH PROPOFOL N/A 06/04/2021   Procedure: COLONOSCOPY WITH PROPOFOL;  Surgeon: Daneil Dolin, MD;  Location: [facility];  Service: Endoscopy;  Laterality: N/A;  9:45am   POLYPECTOMY  06/04/2021   Procedure: POLYPECTOMY;  Surgeon: Daneil Dolin, MD;  Location: [facility];  Service: Endoscopy;;   TUBAL LIGATION      Social History   Socioeconomic History   Marital status: Single    Spouse name: Not on file   Number of children: Not on file   Years of education: Not on  file   Highest education level: Not on file  Occupational History   Not on file  Tobacco Use   Smoking status: Every Day    Packs/day: 0.75    Types: Cigarettes   Smokeless tobacco: Never  Vaping Use   Vaping Use: Never used  Substance and Sexual Activity   Alcohol use: Yes    Comment:  social   Drug use: No   Sexual activity: Not Currently  Other Topics Concern   Not on file  Social History Narrative   Not on file   Social Determinants of Health   Financial Resource Strain: Not on file  Food Insecurity: Not on file  Transportation Needs: Not on file  Physical Activity: Not on file  Stress: Not on file  Social Connections: Not on file  Intimate Partner Violence: Not on file    Outpatient Encounter Medications as of 06/24/2021  Medication Sig   amoxicillin-clavulanate (AUGMENTIN) 875-125 MG tablet Take 1 tablet by mouth 2 (two) times daily for 10 days.   fluticasone (FLONASE) 50 MCG/ACT nasal spray Place 2 sprays into both nostrils daily.   predniSONE (DELTASONE) 20 MG tablet Take 2 tablets (40 mg total) by mouth daily with breakfast for 5 days.   acetaminophen (TYLENOL) 500 MG tablet Take 1,000 mg by mouth every 6 (six) hours as needed for moderate pain.   Ascorbic Acid (VITAMIN C) 1000 MG tablet Take 1,000 mg by mouth daily.   atorvastatin (LIPITOR) 40 MG tablet Take 1 tablet (40 mg total) by mouth daily.   cholecalciferol (VITAMIN D3) 25 MCG (1000 UNIT) tablet Take 1,000 Units by mouth daily.   gabapentin (NEURONTIN) 300 MG capsule Take 300 mg by mouth at bedtime.   lisinopril (ZESTRIL) 20 MG tablet Take 1 tablet (20 mg total) by mouth daily.   Naproxen Sod-diphenhydrAMINE (ALEVE PM) 220-25 MG TABS Take 2 tablets by mouth at bedtime as needed (sleep).   Omega-3 Fatty Acids (FISH OIL) 500 MG CAPS Take 500 mg by mouth daily.   OVER THE COUNTER MEDICATION Take 1 tablet by mouth daily. Nervive otc supplement   polyethylene glycol-electrolytes (TRILYTE) 420 g solution Take 4,000 mLs by mouth as directed.   shark liver oil-cocoa butter (PREPARATION H) 0.25-3-85.5 % suppository Place 1 suppository rectally as needed for hemorrhoids.   Turmeric 500 MG CAPS Take 500 mg by mouth daily.   vitamin B-12 (CYANOCOBALAMIN) 500 MCG tablet Take 500 mcg by mouth daily.    [DISCONTINUED] hydrocortisone (ANUSOL-HC) 2.5 % rectal cream Place 1 application. rectally 2 (two) times daily. (Patient not taking: Reported on 05/29/2021)   No facility-administered encounter medications on file as of 06/24/2021.    No Known Allergies  Review of Systems  Constitutional:  Positive for activity change and fatigue. Negative for appetite change, chills, diaphoresis, fever and unexpected weight change.  HENT:  Positive for congestion, ear pain, postnasal drip, rhinorrhea, sinus pressure, sinus pain and sneezing. Negative for dental problem, drooling, ear discharge, facial swelling, hearing loss, hoarse voice, mouth sores, nosebleeds, sore throat, tinnitus, trouble swallowing and voice change.   Eyes:  Negative for photophobia and visual disturbance.  Respiratory:  Positive for cough and wheezing. Negative for apnea, choking, chest tightness, shortness of breath and stridor.   Cardiovascular:  Negative for chest pain, palpitations and leg swelling.  Gastrointestinal:  Negative for abdominal pain, diarrhea, nausea and vomiting.  Genitourinary:  Negative for decreased urine volume, difficulty urinating and dysuria.  Musculoskeletal:  Negative for arthralgias, joint pain, myalgias and  neck pain.  Skin:  Negative for rash.  Neurological:  Positive for headaches. Negative for dizziness, tremors, seizures, syncope, facial asymmetry, speech difficulty, weakness, light-headedness and numbness.  Psychiatric/Behavioral:  Negative for confusion.   All other systems reviewed and are negative.       Observations/Objective: No vital signs or physical exam, this was a virtual health encounter.  Pt alert and oriented, answers all questions appropriately, and able to speak in full sentences.    Assessment and Plan: Aiza was seen today for sinusitis.  Diagnoses and all orders for this visit:  URI with cough and congestion Wheezing Ongoing and worsening URI symptoms. Symptomatic care  discussed in detail. Aware to continue antihistamine and cough suppressant at home. Add Flonase, prednisone, and Augmentin as prescribed. Aware to report new, worsening, or persistent symptoms.  -     fluticasone (FLONASE) 50 MCG/ACT nasal spray; Place 2 sprays into both nostrils daily. -     amoxicillin-clavulanate (AUGMENTIN) 875-125 MG tablet; Take 1 tablet by mouth 2 (two) times daily for 10 days. -     predniSONE (DELTASONE) 20 MG tablet; Take 2 tablets (40 mg total) by mouth daily with breakfast for 5 days.     Follow Up Instructions: Return if symptoms worsen or fail to improve.    I discussed the assessment and treatment plan with the patient. The patient was provided an opportunity to ask questions and all were answered. The patient agreed with the plan and demonstrated an understanding of the instructions.   The patient was advised to call back or seek an in-person evaluation if the symptoms worsen or if the condition fails to improve as anticipated.  The above assessment and management plan was discussed with the patient. The patient verbalized understanding of and has agreed to the management plan. Patient is aware to call the clinic if they develop any new symptoms or if symptoms persist or worsen. Patient is aware when to return to the clinic for a follow-up visit. Patient educated on when it is appropriate to go to the emergency department.    I provided 15 minutes of time during this telephone encounter.   Monia Pouch, FNP-C [facility] [address] [place], [place] [address] [phone] 06/24/2021

## 2021-06-26 ENCOUNTER — Ambulatory Visit: Payer: Medicaid Other

## 2021-07-03 ENCOUNTER — Encounter: Payer: 59 | Admitting: Gastroenterology

## 2021-07-31 ENCOUNTER — Encounter: Payer: Self-pay | Admitting: Nurse Practitioner

## 2021-07-31 ENCOUNTER — Ambulatory Visit (INDEPENDENT_AMBULATORY_CARE_PROVIDER_SITE_OTHER): Payer: 59 | Admitting: Nurse Practitioner

## 2021-07-31 VITALS — BP 132/86 | HR 86 | Temp 98.0°F | Resp 20 | Ht 67.0 in | Wt 184.0 lb

## 2021-07-31 DIAGNOSIS — M65312 Trigger thumb, left thumb: Secondary | ICD-10-CM | POA: Diagnosis not present

## 2021-07-31 DIAGNOSIS — D509 Iron deficiency anemia, unspecified: Secondary | ICD-10-CM

## 2021-07-31 LAB — HEMOGLOBIN, FINGERSTICK: Hemoglobin: 13.4 g/dL (ref 11.1–15.9)

## 2021-07-31 NOTE — Patient Instructions (Signed)
Trigger Finger  Trigger finger, also called stenosing tenosynovitis,  is a condition that causes a finger to get stuck in a bent position. Each finger has a tendon, which is a tough, cord-like tissue that connects muscle to bone, and each tendon passes through a tunnel of tissue called a tendon sheath. To move your finger, your tendon needs to glide freely through the sheath. Trigger finger happens when the tendon or the sheath thickens, making it difficult to move your finger. Trigger finger can affect any finger or a thumb. It may affect more than one finger. Mild cases may clear up with rest and medicine. Severe cases require more treatment. What are the causes? Trigger finger is caused by a thickened finger tendon or tendon sheath. The cause of this thickening is not known. What increases the risk? The following factors may make you more likely to develop this condition: Doing activities that require a strong grip. Having rheumatoid arthritis, gout, or diabetes. Being 40-60 years old. Being female. What are the signs or symptoms? Symptoms of this condition include: Pain when bending or straightening your finger. Tenderness or swelling where your finger attaches to the palm of your hand. A lump in the palm of your hand or on the inside of your finger. Hearing a noise like a pop or a snap when you try to straighten your finger. Feeling a catching or locking sensation when you try to straighten your finger. Being unable to straighten your finger. How is this diagnosed? This condition is diagnosed based on your symptoms and a physical exam. How is this treated? This condition may be treated by: Resting your finger and avoiding activities that make symptoms worse. Wearing a finger splint to keep your finger extended. Taking NSAIDs, such as ibuprofen, to relieve pain and swelling. Doing gentle exercises to stretch the finger as told by your health care provider. Having medicine that reduces  swelling and inflammation (steroids) injected into the tendon sheath. Injections may need to be repeated. Having surgery to open the tendon sheath. This may be done if other treatments do not work and you cannot straighten your finger. You may need physical therapy after surgery. Follow these instructions at home: If you have a splint: Wear the splint as told by your health care provider. Remove it only as told by your health care provider. Loosen it if your fingers tingle, become numb, or turn cold and blue. Keep it clean. If the splint is not waterproof: Do not let it get wet. Cover it with a watertight covering when you take a bath or shower. Managing pain, stiffness, and swelling     If directed, apply heat to the affected area as often as told by your health care provider. Use the heat source that your health care provider recommends, such as a moist heat pack or a heating pad. Place a towel between your skin and the heat source. Leave the heat on for 20-30 minutes. Remove the heat if your skin turns bright red. This is especially important if you are unable to feel pain, heat, or cold. You may have a greater risk of getting burned. If directed, put ice on the painful area. To do this: If you have a removable splint, remove it as told by your health care provider. Put ice in a plastic bag. Place a towel between your skin and the bag or between your splint and the bag. Leave the ice on for 20 minutes, 2-3 times a day.  Activity Rest   your finger as told by your health care provider. Avoid activities that make the pain worse. Return to your normal activities as told by your health care provider. Ask your health care provider what activities are safe for you. Do exercises as told by your health care provider. Ask your health care provider when it is safe to drive if you have a splint on your hand. General instructions Take over-the-counter and prescription medicines only as told by  your health care provider. Keep all follow-up visits as told by your health care provider. This is important. Contact a health care provider if: Your symptoms are not improving with home care. Summary Trigger finger, also called stenosing tenosynovitis, causes your finger to get stuck in a bent position. This can make it difficult and painful to straighten your finger. This condition develops when a finger tendon or tendon sheath thickens. Treatment may include resting your finger, wearing a splint, and taking medicines. In severe cases, surgery to open the tendon sheath may be needed. This information is not intended to replace advice given to you by your health care provider. Make sure you discuss any questions you have with your health care provider. Document Revised: 06/06/2018 Document Reviewed: 06/06/2018 Elsevier Patient Education  2023 [employer]  

## 2021-07-31 NOTE — Progress Notes (Signed)
   Subjective:    Patient ID: Monica Elliott, female    DOB: 1960/05/30, 61 y.o.   MRN: [id number]   Chief Complaint: Left thumb pain and Wants hemoglobin checked   HPI Patient comes in c/o left trigger thumb. She says she has trouble bending her thumb and when she does it is almost like it gets stuck in bent position and when it does straighten it POPS.  She has a history of anemia. Last hgb was 13.1 on 04/23/21. PCP stopped her iron supplement and now she is feeling sluggish.  Review of Systems  Constitutional:  Negative for diaphoresis.  Eyes:  Negative for pain.  Respiratory:  Negative for shortness of breath.   Cardiovascular:  Negative for chest pain, palpitations and leg swelling.  Gastrointestinal:  Negative for abdominal pain.  Endocrine: Negative for polydipsia.  Musculoskeletal:  Positive for joint swelling (left thumb).  Skin:  Negative for rash.  Neurological:  Negative for dizziness, weakness and headaches.  Hematological:  Does not bruise/bleed easily.  All other systems reviewed and are negative.      Objective:   Physical Exam Constitutional:      Appearance: Normal appearance.  Cardiovascular:     Rate and Rhythm: Normal rate and regular rhythm.     Heart sounds: Normal heart sounds.  Pulmonary:     Effort: Pulmonary effort is normal.     Breath sounds: Normal breath sounds.  Musculoskeletal:     Comments: Decreased ROM of left thumb at distal PIP joint- limited flexion.  Neurological:     General: No focal deficit present.     Mental Status: She is alert and oriented to person, place, and time.  Psychiatric:        Mood and Affect: Mood normal.        Behavior: Behavior normal.    BP 132/86   Pulse 86   Temp 98 F (36.7 C) (Temporal)   Resp 20   Ht '5\' 7"'$  (1.702 m)   Wt 184 lb (83.5 kg)   LMP 05/10/2011   SpO2 95%   BMI 28.82 kg/m         Assessment & Plan:   Monica Elliott in today with chief complaint of Left thumb pain and Wants  hemoglobin checked   1. Iron deficiency anemia, unspecified iron deficiency anemia type If below 12 needs to go back on iron supplement - Hemoglobin, fingerstick  2. Trigger thumb, left thumb Referral made Wear brace when can - Ambulatory referral to Orthopedic Surgery    The above assessment and management plan was discussed with the patient. The patient verbalized understanding of and has agreed to the management plan. Patient is aware to call the clinic if symptoms persist or worsen. Patient is aware when to return to the clinic for a follow-up visit. Patient educated on when it is appropriate to go to the emergency department.   Monica Elliott Done, FNP

## 2021-08-06 ENCOUNTER — Ambulatory Visit: Payer: 59 | Admitting: Family Medicine

## 2021-08-27 ENCOUNTER — Ambulatory Visit (INDEPENDENT_AMBULATORY_CARE_PROVIDER_SITE_OTHER): Payer: 59 | Admitting: Family Medicine

## 2021-08-27 ENCOUNTER — Encounter: Payer: Self-pay | Admitting: Family Medicine

## 2021-08-27 DIAGNOSIS — G8929 Other chronic pain: Secondary | ICD-10-CM

## 2021-08-27 DIAGNOSIS — M5441 Lumbago with sciatica, right side: Secondary | ICD-10-CM

## 2021-08-27 MED ORDER — DICLOFENAC SODIUM 75 MG PO TBEC: 75.0000 mg | DELAYED_RELEASE_TABLET | Freq: Two times a day (BID) | ORAL | 2 refills | Status: DC

## 2021-08-27 MED ORDER — PREDNISONE 10 MG (21) PO TBPK: ORAL_TABLET | ORAL | 0 refills | Status: DC

## 2021-08-27 MED ORDER — METHOCARBAMOL 500 MG PO TABS: 500.0000 mg | ORAL_TABLET | Freq: Three times a day (TID) | ORAL | 2 refills | Status: DC | PRN

## 2021-08-27 NOTE — Progress Notes (Signed)
Virtual Visit via Telephone Note  I connected with Monica Elliott on 08/27/21 at 8:59 AM by telephone and verified that I am speaking with the correct person using two identifiers. Monica Elliott is currently located in her vehicle and nobody is currently with her during this visit. The provider, Loman Brooklyn, FNP is located in their office at time of visit.  I discussed the limitations, risks, security and privacy concerns of performing an evaluation and management service by telephone and the availability of in person appointments. I also discussed with the patient that there may be a patient responsible charge related to this service. The patient expressed understanding and agreed to proceed.  Subjective: PCP: Loman Brooklyn, FNP  Chief Complaint  Patient presents with   Back Pain   Patient reports chronic low back pain that has gotten worse recently. She does have sciatic pain on the right that has also worsened. She reports it feels like their is a pull in her lower back that is causing muscle spasms. She does do a lot of lifting at work. She is established with an orthopedic but has been unable to get in to see them due to issues with insurance (Friday plan is ending and she is getting new coverage next month). She has previously received injections which she did not find helpful. She is currently taking gabapentin 300 mg 1-2 times per day which is helpful, as well as Aleve at bedtime which is sometime helpful.    ROS: Per HPI  Current Outpatient Medications:    acetaminophen (TYLENOL) 500 MG tablet, Take 1,000 mg by mouth every 6 (six) hours as needed for moderate pain., Disp: , Rfl:    Ascorbic Acid (VITAMIN C) 1000 MG tablet, Take 1,000 mg by mouth daily., Disp: , Rfl:    atorvastatin (LIPITOR) 40 MG tablet, Take 1 tablet (40 mg total) by mouth daily., Disp: 90 tablet, Rfl: 3   cholecalciferol (VITAMIN D3) 25 MCG (1000 UNIT) tablet, Take 1,000 Units by mouth daily., Disp:  , Rfl:    fluticasone (FLONASE) 50 MCG/ACT nasal spray, Place 2 sprays into both nostrils daily., Disp: 16 g, Rfl: 6   gabapentin (NEURONTIN) 300 MG capsule, Take 300 mg by mouth at bedtime., Disp: , Rfl:    lisinopril (ZESTRIL) 20 MG tablet, Take 1 tablet (20 mg total) by mouth daily., Disp: 90 tablet, Rfl: 3   Naproxen Sod-diphenhydrAMINE (ALEVE PM) 220-25 MG TABS, Take 2 tablets by mouth at bedtime as needed (sleep)., Disp: , Rfl:    Omega-3 Fatty Acids (FISH OIL) 500 MG CAPS, Take 500 mg by mouth daily., Disp: , Rfl:    OVER THE COUNTER MEDICATION, Take 1 tablet by mouth daily. Nervive otc supplement, Disp: , Rfl:    polyethylene glycol-electrolytes (TRILYTE) 420 g solution, Take 4,000 mLs by mouth as directed., Disp: 4000 mL, Rfl: 0   shark liver oil-cocoa butter (PREPARATION H) 0.25-3-85.5 % suppository, Place 1 suppository rectally as needed for hemorrhoids., Disp: , Rfl:    Turmeric 500 MG CAPS, Take 500 mg by mouth daily., Disp: , Rfl:    vitamin B-12 (CYANOCOBALAMIN) 500 MCG tablet, Take 500 mcg by mouth daily., Disp: , Rfl:   No Known Allergies Past Medical History:  Diagnosis Date   Anemia    states low iron   Depression    Hypercholesterolemia    Hypertension    Vitamin D deficiency     Observations/Objective: A&O  No respiratory distress or wheezing audible  over the phone Mood, judgement, and thought processes all WNL  Assessment and Plan: 1. Chronic bilateral low back pain with right-sided sciatica Patient is going to get back in with her orthopedic as soon as insurance allows. - methocarbamol (ROBAXIN) 500 MG tablet; Take 1 tablet (500 mg total) by mouth every 8 (eight) hours as needed for muscle spasms.  Dispense: 60 tablet; Refill: 2 - diclofenac (VOLTAREN) 75 MG EC tablet; Take 1 tablet (75 mg total) by mouth 2 (two) times daily.  Dispense: 60 tablet; Refill: 2 - predniSONE (STERAPRED UNI-PAK 21 TAB) 10 MG (21) TBPK tablet; As directed x 6 days  Dispense: 21  tablet; Refill: 0   Follow Up Instructions:  I discussed the assessment and treatment plan with the patient. The patient was provided an opportunity to ask questions and all were answered. The patient agreed with the plan and demonstrated an understanding of the instructions.   The patient was advised to call back or seek an in-person evaluation if the symptoms worsen or if the condition fails to improve as anticipated.  The above assessment and management plan was discussed with the patient. The patient verbalized understanding of and has agreed to the management plan. Patient is aware to call the clinic if symptoms persist or worsen. Patient is aware when to return to the clinic for a follow-up visit. Patient educated on when it is appropriate to go to the emergency department.   Time call ended: 9:10 AM  I provided 11 minutes of non-face-to-face time during this encounter.  Hendricks Limes, MSN, APRN, FNP-C [facility] 08/27/21

## 2021-09-22 ENCOUNTER — Encounter: Payer: Self-pay | Admitting: *Deleted

## 2021-09-25 ENCOUNTER — Other Ambulatory Visit: Payer: Self-pay | Admitting: Family Medicine

## 2021-11-20 ENCOUNTER — Other Ambulatory Visit: Payer: Self-pay | Admitting: Family Medicine

## 2021-11-20 DIAGNOSIS — G8929 Other chronic pain: Secondary | ICD-10-CM

## 2022-02-27 ENCOUNTER — Telehealth: Payer: Self-pay | Admitting: Family Medicine

## 2022-02-27 NOTE — Telephone Encounter (Signed)
Patient is wanting to switch to [place] or [employer]. [facility] has been changed to PCP. Made an appt to have a CPE with [facility] after Blanch Media left but would prefer a female.

## 2022-03-02 NOTE — Telephone Encounter (Signed)
okay

## 2022-03-02 NOTE — Telephone Encounter (Signed)
Ok to switch 

## 2022-03-12 ENCOUNTER — Encounter: Payer: Self-pay | Admitting: Family Medicine

## 2022-03-12 ENCOUNTER — Ambulatory Visit (INDEPENDENT_AMBULATORY_CARE_PROVIDER_SITE_OTHER): Payer: Commercial Managed Care - HMO | Admitting: Family Medicine

## 2022-03-12 VITALS — BP 132/87 | HR 92 | Temp 97.4°F | Ht 67.0 in | Wt 184.5 lb

## 2022-03-12 DIAGNOSIS — I10 Essential (primary) hypertension: Secondary | ICD-10-CM

## 2022-03-12 DIAGNOSIS — F325 Major depressive disorder, single episode, in full remission: Secondary | ICD-10-CM

## 2022-03-12 DIAGNOSIS — F411 Generalized anxiety disorder: Secondary | ICD-10-CM

## 2022-03-12 DIAGNOSIS — G8929 Other chronic pain: Secondary | ICD-10-CM

## 2022-03-12 DIAGNOSIS — E782 Mixed hyperlipidemia: Secondary | ICD-10-CM | POA: Diagnosis not present

## 2022-03-12 DIAGNOSIS — M5441 Lumbago with sciatica, right side: Secondary | ICD-10-CM

## 2022-03-12 DIAGNOSIS — E559 Vitamin D deficiency, unspecified: Secondary | ICD-10-CM

## 2022-03-12 DIAGNOSIS — N3281 Overactive bladder: Secondary | ICD-10-CM

## 2022-03-12 DIAGNOSIS — Z720 Tobacco use: Secondary | ICD-10-CM

## 2022-03-12 DIAGNOSIS — D509 Iron deficiency anemia, unspecified: Secondary | ICD-10-CM

## 2022-03-12 DIAGNOSIS — R350 Frequency of micturition: Secondary | ICD-10-CM

## 2022-03-12 LAB — BAYER DCA HB A1C WAIVED: HB A1C (BAYER DCA - WAIVED): 6.2 % — ABNORMAL HIGH (ref 4.8–5.6)

## 2022-03-12 LAB — LIPID PANEL

## 2022-03-12 MED ORDER — DICLOFENAC SODIUM 75 MG PO TBEC: 75.0000 mg | DELAYED_RELEASE_TABLET | Freq: Two times a day (BID) | ORAL | 2 refills | Status: DC

## 2022-03-12 MED ORDER — MIRABEGRON ER 25 MG PO TB24: 25.0000 mg | ORAL_TABLET | Freq: Every day | ORAL | 1 refills | Status: DC

## 2022-03-12 NOTE — Progress Notes (Signed)
Established Patient Office Visit  Subjective   Patient ID: Monica Elliott, female    DOB: 1960-12-08  Age: 62 y.o. MRN: [id number]  Chief Complaint  Patient presents with   Medical Management of Chronic Issues   Hypertension    Hypertension   HTN Complaint with meds - Yes Current Medications - lisinopril Exercising Regularly - No Pertinent ROS:  Headache - No Fatigue - No Visual Disturbances - No Chest pain - No Dyspnea - No Palpitations - No LE edema - No  2. HLD She takes liptior daily and omega 3 supplement daily. She eats regular diet. She has been eating more fruits and spinach lately.   3. Chronic back pain Hx of ruptured disc. She has chronic lower back pain with radiating pain down her right thigh. She also has tingling in right toes. She denies saddle anesthesia changes, changes in gait, or changes in bowel or bladder control. Takes voltaren daily, though she has only been taking this once daily.   4. OAB She reports having to get up at night 2-3x a night. She also has frequency and urgency for the day for months now.   5. Anemia Not currently on iron supplement.   6. Vitamin D deficiency On daily supplement.      03/12/2022    8:55 AM 07/31/2021    8:00 AM 04/23/2021    9:32 AM  Depression screen PHQ 2/9  Decreased Interest 0 0 0  Down, Depressed, Hopeless 0 0 0  PHQ - 2 Score 0 0 0  Altered sleeping 2 0 0  Tired, decreased energy 1 0 0  Change in appetite 2 1 0  Feeling bad or failure about yourself  0 0 0  Trouble concentrating 0 0 0  Moving slowly or fidgety/restless 0 0 0  Suicidal thoughts 0 0 0  PHQ-9 Score 5 1 0  Difficult doing work/chores Somewhat difficult Not difficult at all Not difficult at all      03/12/2022    8:55 AM 07/31/2021    8:00 AM 04/23/2021    9:32 AM 10/24/2020    8:23 AM  GAD 7 : Generalized Anxiety Score  Nervous, Anxious, on Edge 0 0 0 1  Control/stop worrying 0 0 0 0  Worry too much - different things 0 0 0 1   Trouble relaxing 0 0 0 1  Restless 0 0 0 0  Easily annoyed or irritable 0 0 0 0  Afraid - awful might happen 0 0 0 0  Total GAD 7 Score 0 0 0 3  Anxiety Difficulty Not difficult at all Not difficult at all Not difficult at all Somewhat difficult      Past Medical History:  Diagnosis Date   Anemia    states low iron   Depression    Hypercholesterolemia    Hypertension    Vitamin D deficiency       ROS As per HPI.    Objective:     BP 132/87   Pulse 92   Temp (!) 97.4 F (36.3 C) (Temporal)   Ht '5\' 7"'$  (1.702 m)   Wt 184 lb 8 oz (83.7 kg)   LMP 05/10/2011   SpO2 99%   BMI 28.90 kg/m    Physical Exam Vitals and nursing note reviewed.  Constitutional:      General: She is not in acute distress.    Appearance: She is not ill-appearing, toxic-appearing or diaphoretic.  HENT:     Head:  Normocephalic and atraumatic.  Eyes:     General: No scleral icterus. Neck:     Thyroid: No thyroid mass, thyromegaly or thyroid tenderness.     Vascular: No carotid bruit.  Cardiovascular:     Rate and Rhythm: Normal rate and regular rhythm.     Heart sounds: Normal heart sounds. No murmur heard. Pulmonary:     Effort: Pulmonary effort is normal. No respiratory distress.     Breath sounds: Normal breath sounds.  Abdominal:     General: Bowel sounds are normal. There is no distension.     Tenderness: There is no abdominal tenderness. There is no right CVA tenderness, guarding or rebound.  Musculoskeletal:     Cervical back: Neck supple. No rigidity.     Right lower leg: No edema.     Left lower leg: No edema.  Skin:    General: Skin is warm and dry.  Neurological:     General: No focal deficit present.     Mental Status: She is alert and oriented to person, place, and time.     Motor: No weakness.     Gait: Gait normal.  Psychiatric:        Mood and Affect: Mood normal.        Behavior: Behavior normal.        Thought Content: Thought content normal.        Judgment:  Judgment normal.      No results found for any visits on 03/12/22.    The 10-year ASCVD risk score (Arnett DK, et al., 2019) is: 15.8%    Assessment & Plan:   Loralee was seen today for medical management of chronic issues and hypertension.  Diagnoses and all orders for this visit:  Primary hypertension Well controlled on current regimen. Discussed diet and exercise. Fasting labs pending.  -     CMP14+EGFR -     Lipid panel -     TSH -     Anemia Profile B  Mixed hyperlipidemia Labs pending. On statin and Omega 3 supplement.  -     CMP14+EGFR -     Lipid panel  Iron deficiency anemia, unspecified iron deficiency anemia type Not currently on supplement. Labs pending.  -     Anemia Profile B  Major depressive disorder with single episode, in full remission ([place]) Generalized anxiety disorder Well controlled without medication.   Vitamin D deficiency On supplement. Labs pending. -     Vitamin D, 25-hydroxy  Urinary frequency Will check A1c today, though likely due to OAB.  -     Bayer DCA Hb A1c Waived  OAB (overactive bladder) Try myrbetriq as below.  -     mirabegron ER (MYRBETRIQ) 25 MG TB24 tablet; Take 1 tablet (25 mg total) by mouth daily.  Chronic bilateral low back pain with right-sided sciatica Refill provided. Discussed to take BID.  -     diclofenac (VOLTAREN) 75 MG EC tablet; Take 1 tablet (75 mg total) by mouth 2 (two) times daily.  Tobacco use Not ready to quit.    Return in about 6 months (around 09/10/2022) for chronic follow up.  The patient indicates understanding of these issues and agrees with the plan.  Gwenlyn Perking, FNP

## 2022-03-13 LAB — ANEMIA PROFILE B
Basophils Absolute: 0.1 10*3/uL (ref 0.0–0.2)
Basos: 1 %
EOS (ABSOLUTE): 0 10*3/uL (ref 0.0–0.4)
Eos: 0 %
Ferritin: 167 ng/mL — ABNORMAL HIGH (ref 15–150)
Folate: 15.8 ng/mL (ref >3.0)
Hematocrit: 39.6 % (ref 34.0–46.6)
Hemoglobin: 13.5 g/dL (ref 11.1–15.9)
Immature Grans (Abs): 0 10*3/uL (ref 0.0–0.1)
Immature Granulocytes: 0 %
Iron Saturation: 26 % (ref 15–55)
Iron: 87 ug/dL (ref 27–139)
Lymphocytes Absolute: 2.3 10*3/uL (ref 0.7–3.1)
Lymphs: 27 %
MCH: 29.5 pg (ref 26.6–33.0)
MCHC: 34.1 g/dL (ref 31.5–35.7)
MCV: 87 fL (ref 79–97)
Monocytes Absolute: 0.5 10*3/uL (ref 0.1–0.9)
Monocytes: 6 %
Neutrophils Absolute: 5.5 10*3/uL (ref 1.4–7.0)
Neutrophils: 66 %
Platelets: 204 10*3/uL (ref 150–450)
RBC: 4.57 x10E6/uL (ref 3.77–5.28)
RDW: 14.7 % (ref 11.7–15.4)
Retic Ct Pct: 1.8 % (ref 0.6–2.6)
Total Iron Binding Capacity: 329 ug/dL (ref 250–450)
UIBC: 242 ug/dL (ref 118–369)
Vitamin B-12: 1068 pg/mL (ref 232–1245)
WBC: 8.4 10*3/uL (ref 3.4–10.8)

## 2022-03-13 LAB — CMP14+EGFR
ALT: 23 IU/L (ref 0–32)
AST: 19 IU/L (ref 0–40)
Albumin/Globulin Ratio: 2.2 (ref 1.2–2.2)
Albumin: 4.8 g/dL (ref 3.9–4.9)
Alkaline Phosphatase: 85 IU/L (ref 44–121)
BUN/Creatinine Ratio: 22 (ref 12–28)
BUN: 19 mg/dL (ref 8–27)
Bilirubin Total: 0.3 mg/dL (ref 0.0–1.2)
CO2: 23 mmol/L (ref 20–29)
Calcium: 9.7 mg/dL (ref 8.7–10.3)
Chloride: 104 mmol/L (ref 96–106)
Creatinine, Ser: 0.85 mg/dL (ref 0.57–1.00)
Globulin, Total: 2.2 g/dL (ref 1.5–4.5)
Glucose: 102 mg/dL — ABNORMAL HIGH (ref 70–99)
Potassium: 4.3 mmol/L (ref 3.5–5.2)
Sodium: 142 mmol/L (ref 134–144)
Total Protein: 7 g/dL (ref 6.0–8.5)
eGFR: 77 mL/min/{1.73_m2} (ref >59)

## 2022-03-13 LAB — LIPID PANEL
Chol/HDL Ratio: 3.9 ratio (ref 0.0–4.4)
Cholesterol, Total: 202 mg/dL — ABNORMAL HIGH (ref 100–199)
HDL: 52 mg/dL (ref >39)
LDL Chol Calc (NIH): 124 mg/dL — ABNORMAL HIGH (ref 0–99)
Triglycerides: 144 mg/dL (ref 0–149)
VLDL Cholesterol Cal: 26 mg/dL (ref 5–40)

## 2022-03-13 LAB — VITAMIN D 25 HYDROXY (VIT D DEFICIENCY, FRACTURES): Vit D, 25-Hydroxy: 55.7 ng/mL (ref 30.0–100.0)

## 2022-03-13 LAB — TSH: TSH: 1.2 u[IU]/mL (ref 0.450–4.500)

## 2022-03-16 ENCOUNTER — Other Ambulatory Visit: Payer: Self-pay | Admitting: Family Medicine

## 2022-03-16 ENCOUNTER — Telehealth: Payer: Self-pay | Admitting: Family Medicine

## 2022-03-16 DIAGNOSIS — E782 Mixed hyperlipidemia: Secondary | ICD-10-CM

## 2022-03-16 DIAGNOSIS — I10 Essential (primary) hypertension: Secondary | ICD-10-CM

## 2022-03-16 MED ORDER — ATORVASTATIN CALCIUM 80 MG PO TABS: 80.0000 mg | ORAL_TABLET | Freq: Every day | ORAL | 1 refills | Status: DC

## 2022-03-16 MED ORDER — OXYBUTYNIN CHLORIDE ER 10 MG PO TB24: 10.0000 mg | ORAL_TABLET | Freq: Every day | ORAL | 0 refills | Status: DC

## 2022-03-16 NOTE — Telephone Encounter (Signed)
Patient said the Myrbetriq was too expensive, sent oxybutynin for that she can try or at least see how expensive it could be for her.  Discuss further with PCP in the future

## 2022-03-23 ENCOUNTER — Other Ambulatory Visit: Payer: Self-pay | Admitting: Family Medicine

## 2022-03-23 DIAGNOSIS — Z1231 Encounter for screening mammogram for malignant neoplasm of breast: Secondary | ICD-10-CM

## 2022-03-25 ENCOUNTER — Ambulatory Visit
Admission: RE | Admit: 2022-03-25 | Discharge: 2022-03-25 | Disposition: A | Discharge status: Home / Self Care | Payer: Commercial Managed Care - HMO | Source: Ambulatory Visit | Attending: Family Medicine | Admitting: Family Medicine

## 2022-03-25 DIAGNOSIS — Z1231 Encounter for screening mammogram for malignant neoplasm of breast: Secondary | ICD-10-CM

## 2022-04-28 ENCOUNTER — Encounter: Payer: 59 | Admitting: Family Medicine

## 2022-05-09 ENCOUNTER — Other Ambulatory Visit: Payer: Self-pay | Admitting: Family Medicine

## 2022-05-09 DIAGNOSIS — I10 Essential (primary) hypertension: Secondary | ICD-10-CM

## 2022-05-28 ENCOUNTER — Telehealth: Payer: Self-pay | Admitting: Family Medicine

## 2022-05-28 DIAGNOSIS — I10 Essential (primary) hypertension: Secondary | ICD-10-CM

## 2022-05-28 DIAGNOSIS — E782 Mixed hyperlipidemia: Secondary | ICD-10-CM

## 2022-05-28 DIAGNOSIS — R7303 Prediabetes: Secondary | ICD-10-CM

## 2022-06-02 NOTE — Telephone Encounter (Signed)
Lab orders placed and pt has lab appt 06/09/22 at 8:45 and pt is aware.

## 2022-06-09 ENCOUNTER — Other Ambulatory Visit: Payer: Commercial Managed Care - HMO

## 2022-06-09 DIAGNOSIS — E782 Mixed hyperlipidemia: Secondary | ICD-10-CM

## 2022-06-09 DIAGNOSIS — R7303 Prediabetes: Secondary | ICD-10-CM

## 2022-06-09 DIAGNOSIS — I10 Essential (primary) hypertension: Secondary | ICD-10-CM

## 2022-06-09 LAB — CMP14+EGFR
ALT: 32 IU/L (ref 0–32)
AST: 23 IU/L (ref 0–40)
Albumin/Globulin Ratio: 1.8 (ref 1.2–2.2)
Albumin: 4.4 g/dL (ref 3.9–4.9)
Alkaline Phosphatase: 73 IU/L (ref 44–121)
BUN/Creatinine Ratio: 19 (ref 12–28)
BUN: 21 mg/dL (ref 8–27)
Bilirubin Total: 0.2 mg/dL (ref 0.0–1.2)
CO2: 18 mmol/L — ABNORMAL LOW (ref 20–29)
Calcium: 9.4 mg/dL (ref 8.7–10.3)
Chloride: 105 mmol/L (ref 96–106)
Creatinine, Ser: 1.09 mg/dL — ABNORMAL HIGH (ref 0.57–1.00)
Globulin, Total: 2.4 g/dL (ref 1.5–4.5)
Glucose: 85 mg/dL (ref 70–99)
Potassium: 5 mmol/L (ref 3.5–5.2)
Sodium: 140 mmol/L (ref 134–144)
Total Protein: 6.8 g/dL (ref 6.0–8.5)
eGFR: 57 mL/min/{1.73_m2} — ABNORMAL LOW (ref >59)

## 2022-06-09 LAB — LIPID PANEL
Chol/HDL Ratio: 3.9 ratio (ref 0.0–4.4)
Cholesterol, Total: 177 mg/dL (ref 100–199)
HDL: 45 mg/dL (ref >39)
LDL Chol Calc (NIH): 104 mg/dL — ABNORMAL HIGH (ref 0–99)
Triglycerides: 158 mg/dL — ABNORMAL HIGH (ref 0–149)
VLDL Cholesterol Cal: 28 mg/dL (ref 5–40)

## 2022-06-09 LAB — BAYER DCA HB A1C WAIVED: HB A1C (BAYER DCA - WAIVED): 5.8 % — ABNORMAL HIGH (ref 4.8–5.6)

## 2022-06-12 ENCOUNTER — Ambulatory Visit: Payer: Commercial Managed Care - HMO | Admitting: Family Medicine

## 2022-06-12 ENCOUNTER — Encounter: Payer: Self-pay | Admitting: Family Medicine

## 2022-06-12 VITALS — BP 114/73 | HR 93 | Temp 98.2°F | Ht 67.0 in | Wt 181.2 lb

## 2022-06-12 DIAGNOSIS — Z0000 Encounter for general adult medical examination without abnormal findings: Secondary | ICD-10-CM

## 2022-06-12 DIAGNOSIS — M5441 Lumbago with sciatica, right side: Secondary | ICD-10-CM | POA: Diagnosis not present

## 2022-06-12 DIAGNOSIS — E782 Mixed hyperlipidemia: Secondary | ICD-10-CM

## 2022-06-12 DIAGNOSIS — R7303 Prediabetes: Secondary | ICD-10-CM | POA: Diagnosis not present

## 2022-06-12 DIAGNOSIS — G8929 Other chronic pain: Secondary | ICD-10-CM | POA: Insufficient documentation

## 2022-06-12 DIAGNOSIS — R944 Abnormal results of kidney function studies: Secondary | ICD-10-CM

## 2022-06-12 DIAGNOSIS — Z0001 Encounter for general adult medical examination with abnormal findings: Secondary | ICD-10-CM

## 2022-06-12 NOTE — Progress Notes (Signed)
Complete physical exam  Patient: Monica Elliott   DOB: 15-Dec-1960   62 y.o. Female  MRN: [id number]  Subjective:    Chief Complaint  Patient presents with   Annual Exam    Monica Elliott is a 62 y.o. female who presents today for a complete physical exam. She reports consuming a  well balanced  diet. Gym/ health club routine includes stationary bike and treadmill. She generally feels fairly well. She reports sleeping fairly well. She does have additional problems to discuss today.   Reports chronic back pain every day all day. Numbness/tingling down right leg. Had injections in past and this wasn't helpful. Previous ortho wanted her to have surgery. She doesn't wish to have surgery again and would like a referral for a second option.   Most recent fall risk assessment:    06/12/2022    1:46 PM  Fall Risk   Falls in the past year? 0     Most recent depression screenings:    06/12/2022    1:46 PM 03/12/2022    8:55 AM  PHQ 2/9 Scores  PHQ - 2 Score 0 0  PHQ- 9 Score 0 5    Vision:Not within last year  and Dental: No current dental problems  Past Medical History:  Diagnosis Date   Anemia    states low iron   Depression    Hypercholesterolemia    Hypertension    Vitamin D deficiency       Patient Care Team: Gabriel Earing, FNP as PCP - General (Family Medicine) Jena Gauss Gerrit Friends, MD (Gastroenterology) Lajean Saver, NT as Technician (Internal Medicine)   Outpatient Medications Prior to Visit  Medication Sig   acetaminophen (TYLENOL) 500 MG tablet Take 1,000 mg by mouth every 6 (six) hours as needed for moderate pain.   Ascorbic Acid (VITAMIN C) 1000 MG tablet Take 1,000 mg by mouth daily.   atorvastatin (LIPITOR) 80 MG tablet Take 1 tablet (80 mg total) by mouth daily.   cholecalciferol (VITAMIN D3) 25 MCG (1000 UNIT) tablet Take 1,000 Units by mouth daily.   diclofenac (VOLTAREN) 75 MG EC tablet Take 1 tablet (75 mg total) by mouth 2 (two) times daily.    lisinopril (ZESTRIL) 20 MG tablet TAKE 1 TABLET BY MOUTH EVERY DAY   Omega-3 Fatty Acids (FISH OIL) 500 MG CAPS Take 500 mg by mouth daily.   OVER THE COUNTER MEDICATION Take 1 tablet by mouth daily. Nervive otc supplement   Turmeric 500 MG CAPS Take 500 mg by mouth daily.   vitamin B-12 (CYANOCOBALAMIN) 500 MCG tablet Take 500 mcg by mouth daily.   [DISCONTINUED] oxybutynin (DITROPAN XL) 10 MG 24 hr tablet Take 1 tablet (10 mg total) by mouth at bedtime.   No facility-administered medications prior to visit.    ROS Negative unless specially indicated above in HPI.    Objective:     BP 114/73   Pulse 93   Temp 98.2 F (36.8 C) (Temporal)   Ht 5\' 7"  (1.702 m)   Wt 181 lb 4 oz (82.2 kg)   LMP 05/10/2011   SpO2 97%   BMI 28.39 kg/m    Physical Exam Vitals and nursing note reviewed.  Constitutional:      General: She is not in acute distress.    Appearance: Normal appearance. She is not ill-appearing.  HENT:     Head: Normocephalic.     Right Ear: Tympanic membrane, ear canal and external ear normal.  Left Ear: Tympanic membrane, ear canal and external ear normal.     Nose: Nose normal.     Mouth/Throat:     Mouth: Mucous membranes are dry.     Pharynx: Oropharynx is clear.  Eyes:     Extraocular Movements: Extraocular movements intact.     Conjunctiva/sclera: Conjunctivae normal.     Pupils: Pupils are equal, round, and reactive to light.  Neck:     Thyroid: No thyroid mass, thyromegaly or thyroid tenderness.  Cardiovascular:     Rate and Rhythm: Normal rate and regular rhythm.     Pulses: Normal pulses.     Heart sounds: Normal heart sounds. No murmur heard.    No friction rub. No gallop.  Pulmonary:     Effort: Pulmonary effort is normal.     Breath sounds: Normal breath sounds.  Abdominal:     General: Bowel sounds are normal. There is no distension.     Palpations: Abdomen is soft. There is no mass.     Tenderness: There is no abdominal tenderness. There  is no guarding.  Musculoskeletal:     Cervical back: Normal range of motion and neck supple. No tenderness.     Right lower leg: No edema.     Left lower leg: No edema.  Skin:    General: Skin is warm and dry.     Capillary Refill: Capillary refill takes less than 2 seconds.     Findings: No lesion or rash.  Neurological:     General: No focal deficit present.     Mental Status: She is alert and oriented to person, place, and time.     Cranial Nerves: No cranial nerve deficit.     Motor: No weakness.     Gait: Gait normal.  Psychiatric:        Mood and Affect: Mood normal.        Behavior: Behavior normal.        Thought Content: Thought content normal.        Judgment: Judgment normal.      No results found for any visits on 06/12/22.     Assessment & Plan:    Routine Health Maintenance and Physical Exam Monica Elliott was seen today for annual exam.  Diagnoses and all orders for this visit:  Well adult exam  Mixed hyperlipidemia LDL 104. On atorvastatin 80 mg. She has recently started exercising and improving diet.  Prediabetes A1c 5.8, improved from 6.2. Diet and exercise.   Chronic bilateral low back pain with right-sided sciatica No red flags. Referral placed below.  -     Ambulatory referral to Orthopedic Surgery  Decreased GFR Repeat as below. Limit NSAIDs. Increase hydration.  -     BMP8+EGFR; Future    Immunization History  Administered Date(s) Administered   PFIZER(Purple Top)SARS-COV-2 Vaccination 06/23/2019, 07/14/2019, 01/30/2020, 02/20/2020   Tdap 03/22/2020    Health Maintenance  Topic Date Due   COVID-19 Vaccine (5 - 2023-24 season) 03/29/2023 (Originally 10/03/2021)   Zoster Vaccines- Shingrix (1 of 2) 06/10/2023 (Originally 07/04/2008)   HIV Screening  06/12/2023 (Originally 07/04/1973)   INFLUENZA VACCINE  09/03/2022   MAMMOGRAM  03/25/2024   PAP SMEAR-Modifier  03/22/2025   DTaP/Tdap/Td (2 - Td or Tdap) 03/22/2030   COLONOSCOPY (Pts 45-66yrs  Insurance coverage will need to be confirmed)  06/05/2031   Hepatitis C Screening  Completed   HPV VACCINES  Aged Out    Discussed health benefits of physical activity, and encouraged her  to engage in regular exercise appropriate for her age and condition.  Problem List Items Addressed This Visit       Nervous and Auditory   Chronic bilateral low back pain with right-sided sciatica   Relevant Orders   Ambulatory referral to Orthopedic Surgery     Other   Mixed hyperlipidemia   Prediabetes   Other Visit Diagnoses     Well adult exam    -  Primary   Decreased GFR       Relevant Orders   BMP8+EGFR      Return in about 6 months (around 12/13/2022) for chronic follow up.   The patient indicates understanding of these issues and agrees with the plan.  Gabriel Earing, FNP

## 2022-06-12 NOTE — Patient Instructions (Signed)

## 2022-06-16 ENCOUNTER — Other Ambulatory Visit: Payer: Commercial Managed Care - HMO

## 2022-06-16 DIAGNOSIS — R944 Abnormal results of kidney function studies: Secondary | ICD-10-CM

## 2022-06-16 LAB — BMP8+EGFR
BUN/Creatinine Ratio: 21 (ref 12–28)
BUN: 20 mg/dL (ref 8–27)
CO2: 20 mmol/L (ref 20–29)
Calcium: 9.6 mg/dL (ref 8.7–10.3)
Chloride: 103 mmol/L (ref 96–106)
Creatinine, Ser: 0.94 mg/dL (ref 0.57–1.00)
Glucose: 105 mg/dL — ABNORMAL HIGH (ref 70–99)
Potassium: 4.4 mmol/L (ref 3.5–5.2)
Sodium: 137 mmol/L (ref 134–144)
eGFR: 68 mL/min/{1.73_m2} (ref >59)

## 2022-06-25 ENCOUNTER — Telehealth: Payer: Self-pay | Admitting: Family Medicine

## 2022-06-25 ENCOUNTER — Encounter: Payer: Self-pay | Admitting: Orthopaedic Surgery

## 2022-06-25 ENCOUNTER — Ambulatory Visit (INDEPENDENT_AMBULATORY_CARE_PROVIDER_SITE_OTHER): Payer: Commercial Managed Care - HMO | Admitting: Orthopaedic Surgery

## 2022-06-25 ENCOUNTER — Other Ambulatory Visit (INDEPENDENT_AMBULATORY_CARE_PROVIDER_SITE_OTHER): Payer: Commercial Managed Care - HMO

## 2022-06-25 VITALS — Ht 67.0 in | Wt 181.0 lb

## 2022-06-25 DIAGNOSIS — M545 Low back pain, unspecified: Secondary | ICD-10-CM

## 2022-06-25 DIAGNOSIS — M5126 Other intervertebral disc displacement, lumbar region: Secondary | ICD-10-CM

## 2022-06-25 DIAGNOSIS — G8929 Other chronic pain: Secondary | ICD-10-CM | POA: Diagnosis not present

## 2022-06-25 NOTE — Progress Notes (Signed)
Office Visit Note   Patient: Monica Elliott           Date of Birth: Jan 22, 1961           MRN: [id number] Visit Date: 06/25/2022              Requested by: Gabriel Earing, FNP [address] [place],  [place] [address] PCP: Gabriel Earing, FNP   Assessment & Plan: Visit Diagnoses:  1. Chronic right-sided low back pain, unspecified whether sciatica present   2. Protrusion of lumbar intervertebral disc     Plan: Patient had had previous MRI few years ago which showed recurrent disc protrusion with compression at L5-S1 on the right.  She did have some mild stenosis at L3-4 and L4-5 centrally.  She is not having any neurogenic claudication symptoms just back pain and radicular right leg pain.  Will set up some physical therapy if this not effective we can proceed with MRI imaging.  MRI report reviewed with patient in detail as well as images from 05/16/2020.  Follow-Up Instructions: Return in about 6 weeks (around 08/06/2022).   Orders:  Orders Placed This Encounter  Procedures   XR Lumbar Spine 2-3 Views   Ambulatory referral to Physical Therapy   No orders of the defined types were placed in this encounter.     Procedures: No procedures performed   Clinical Data: No additional findings.   Subjective: Chief Complaint  Patient presents with   Lower Back - Pain    HPI 62 year old female normally sees Harlow Mares, FNP here with chronic back pain.  History of previous surgery right laminectomy L5-S1 [place] 2016.  Patient has had injections in the past without relief.  She was recommended a lumbar fusion about a year after surgery.  Patient is been seen in 2021 [facility] injections and again fusion was discussed with her.  She saw Dr. In [place] who recommended no surgery.  She uses diclofenac and Tylenol.  She has back pain and primarily right leg pain. Previous images from 2003, 2016 ,2022 reports MRI reviewed with patient as well as images  2022.   Review of Systems plus for anxiety hypertension hyperlipidemia prediabetes history of tobacco use .  All systems noncontributory to HPI.   Objective: Vital Signs: Ht 5\' 7"  (1.702 m)   Wt 181 lb (82.1 kg)   LMP 05/10/2011   BMI 28.35 kg/m   Physical Exam Constitutional:      Appearance: She is well-developed.  HENT:     Head: Normocephalic.     Right Ear: External ear normal.     Left Ear: External ear normal. There is no impacted cerumen.  Eyes:     Pupils: Pupils are equal, round, and reactive to light.  Neck:     Thyroid: No thyromegaly.     Trachea: No tracheal deviation.  Cardiovascular:     Rate and Rhythm: Normal rate.  Pulmonary:     Effort: Pulmonary effort is normal.  Abdominal:     Palpations: Abdomen is soft.  Musculoskeletal:     Cervical back: No rigidity.  Skin:    General: Skin is warm and dry.  Neurological:     Mental Status: She is alert and oriented to person, place, and time.  Psychiatric:        Behavior: Behavior normal.     Ortho Exam patient has some sciatic notch tenderness right side negative on the left some pain with straight  leg raising 90 degrees she can heel and toe walk negative popliteal compression test negative logroll hips knee and ankle jerk are intact pulses are 2+ and symmetrical normal sensation.  Well-healed lumbar L5-S1 microdiscectomy few millimeters to the right of midline.  No midline defects.  Mild tenderness lumbosacral junction.  Specialty Comments:  No specialty comments available.  Imaging: XR Lumbar Spine 2-3 Views  Result Date: 06/25/2022 [facility] and lateral lumbar images are obtained and reviewed.  Previous laminotomy L5-S1 noted.  Hip joints sacroiliac joints are normal.  No lumbar listhesis and maintained disc space height. Impression: Slight postop changes L5-S1 otherwise unremarkable lumbar radiographs.    PMFS History: Patient Active Problem List   Diagnosis Date Noted   Protrusion of lumbar  intervertebral disc 06/25/2022   Prediabetes 06/12/2022   Chronic bilateral low back pain with right-sided sciatica 06/12/2022   Tobacco use 03/22/2020   Vitamin D deficiency 04/08/2016   Essential hypertension 04/08/2016   Mixed hyperlipidemia 04/08/2016   Iron deficiency anemia 04/08/2016   Anxiety disorder 02/06/2015   Major depressive disorder with single episode, in full remission (HCC) 02/06/2015   Past Medical History:  Diagnosis Date   Anemia    states low iron   Depression    Hypercholesterolemia    Hypertension    Vitamin D deficiency     Family History  Problem Relation Age of Onset   Heart disease Mother    Hypertension Mother    Diabetes Mother    Hypertension Father    Diabetes Sister    Alcohol abuse Brother    Colon cancer Neg Hx    Breast cancer Neg Hx     Past Surgical History:  Procedure Laterality Date   BACK SURGERY  2016   BREAST BIOPSY     COLONOSCOPY  04/06/2011   Colonic diverticulosis and colonic polyps-removed as described above. Polyps benign. Next colonoscopy 04/2021.   COLONOSCOPY WITH PROPOFOL N/A 06/04/2021   Procedure: COLONOSCOPY WITH PROPOFOL;  Surgeon: Corbin Ade, MD;  Location: [facility];  Service: Endoscopy;  Laterality: N/A;  9:45am   POLYPECTOMY  06/04/2021   Procedure: POLYPECTOMY;  Surgeon: Corbin Ade, MD;  Location: [facility];  Service: Endoscopy;;   TUBAL LIGATION     Social History   Occupational History   Not on file  Tobacco Use   Smoking status: Every Day    Packs/day: .75    Types: Cigarettes   Smokeless tobacco: Never  Vaping Use   Vaping Use: Never used  Substance and Sexual Activity   Alcohol use: Yes    Comment: social   Drug use: No   Sexual activity: Not Currently

## 2022-06-25 NOTE — Telephone Encounter (Signed)
Pt states if we put in referral her insurance will probably cover it under Medical. Advised pt to have the report sent to [place] so we can do the referral and pt voiced understanding.

## 2022-06-30 ENCOUNTER — Other Ambulatory Visit: Payer: Self-pay | Admitting: Family Medicine

## 2022-06-30 DIAGNOSIS — G8929 Other chronic pain: Secondary | ICD-10-CM

## 2022-07-20 ENCOUNTER — Other Ambulatory Visit: Payer: Self-pay | Admitting: Family Medicine

## 2022-07-20 DIAGNOSIS — G8929 Other chronic pain: Secondary | ICD-10-CM

## 2022-07-29 ENCOUNTER — Other Ambulatory Visit: Payer: Self-pay | Admitting: Family Medicine

## 2022-07-29 DIAGNOSIS — H579 Unspecified disorder of eye and adnexa: Secondary | ICD-10-CM

## 2022-08-13 ENCOUNTER — Ambulatory Visit: Payer: Commercial Managed Care - HMO | Admitting: Orthopaedic Surgery

## 2022-09-10 ENCOUNTER — Other Ambulatory Visit: Payer: Self-pay | Admitting: Family Medicine

## 2022-09-10 DIAGNOSIS — G8929 Other chronic pain: Secondary | ICD-10-CM

## 2022-09-13 ENCOUNTER — Other Ambulatory Visit: Payer: Self-pay | Admitting: Family Medicine

## 2022-09-13 DIAGNOSIS — E782 Mixed hyperlipidemia: Secondary | ICD-10-CM

## 2022-09-13 DIAGNOSIS — I10 Essential (primary) hypertension: Secondary | ICD-10-CM

## 2022-09-29 ENCOUNTER — Ambulatory Visit (INDEPENDENT_AMBULATORY_CARE_PROVIDER_SITE_OTHER): Payer: Commercial Managed Care - HMO | Admitting: Nurse Practitioner

## 2022-09-29 VITALS — BP 146/88 | HR 91 | Temp 97.3°F | Resp 20 | Ht 67.0 in | Wt 180.0 lb

## 2022-09-29 DIAGNOSIS — M5441 Lumbago with sciatica, right side: Secondary | ICD-10-CM | POA: Diagnosis not present

## 2022-09-29 DIAGNOSIS — G8929 Other chronic pain: Secondary | ICD-10-CM

## 2022-09-29 MED ORDER — METHYLPREDNISOLONE ACETATE 80 MG/ML IJ SUSP
80.0000 mg | Freq: Once | INTRAMUSCULAR | Status: AC
Start: 2022-09-29 — End: 2022-09-29
  Administered 2022-09-29: 80 mg via INTRAMUSCULAR

## 2022-09-29 MED ORDER — PREDNISONE 20 MG PO TABS: ORAL_TABLET | ORAL | 0 refills | Status: DC

## 2022-09-29 MED ORDER — KETOROLAC TROMETHAMINE 60 MG/2ML IM SOLN
60.0000 mg | Freq: Once | INTRAMUSCULAR | Status: AC
Start: 2022-09-29 — End: 2022-09-29
  Administered 2022-09-29: 60 mg via INTRAMUSCULAR

## 2022-09-29 NOTE — Progress Notes (Signed)
   Subjective:    Patient ID: Marelin Afolabi Cruise, female    DOB: [birth year], 62 y.o.   MRN: [id number]   Chief Complaint: Back Pain (Low back. Pain down right leg/)   Back Pain This is a new problem. The current episode started more than 1 year ago. The problem occurs constantly. The problem has been waxing and waning since onset. The pain is present in the lumbar spine. The pain radiates to the right foot. The pain is at a severity of 8/10. The pain is moderate. The pain is The same all the time. The symptoms are aggravated by standing. Pertinent negatives include no bowel incontinence, paresthesias, perianal numbness, tingling or weakness. She has tried NSAIDs for the symptoms. The treatment provided mild relief.    Patient Active Problem List   Diagnosis Date Noted   Protrusion of lumbar intervertebral disc 06/25/2022   Prediabetes 06/12/2022   Chronic bilateral low back pain with right-sided sciatica 06/12/2022   Tobacco use 03/22/2020   Vitamin D deficiency 04/08/2016   Essential hypertension 04/08/2016   Mixed hyperlipidemia 04/08/2016   Iron deficiency anemia 04/08/2016   Anxiety disorder 02/06/2015   Major depressive disorder with single episode, in full remission (HCC) 02/06/2015       Review of Systems  Gastrointestinal:  Negative for bowel incontinence.  Musculoskeletal:  Positive for back pain.  Neurological:  Negative for tingling, weakness and paresthesias.       Objective:   Physical Exam Constitutional:      Appearance: Normal appearance.  Cardiovascular:     Rate and Rhythm: Normal rate and regular rhythm.     Heart sounds: Normal heart sounds.  Pulmonary:     Effort: Pulmonary effort is normal.  Musculoskeletal:     Comments: FROM of lumbar spine with pain on flexion and rotation in either direction (-) SLR bil Motor strength and sensation distally intact  Skin:    General: Skin is warm.  Neurological:     General: No focal deficit present.      Mental Status: She is alert and oriented to person, place, and time.  Psychiatric:        Mood and Affect: Mood normal.        Behavior: Behavior normal.    BP (!) 146/88   Pulse 91   Temp (!) 97.3 F (36.3 C) (Temporal)   Resp 20   Ht 5\' 7"  (1.702 m)   Wt 180 lb (81.6 kg)   LMP 05/10/2011   SpO2 96%   BMI 28.19 kg/m         Assessment & Plan:   Dasiah Nees Dunton in today with chief complaint of Back Pain (Low back. Pain down right leg/)   1. Chronic bilateral low back pain with right-sided sciatica Moist heat rest - ketorolac (TORADOL) injection 60 mg - methylPREDNISolone acetate (DEPO-MEDROL) injection 80 mg - predniSONE (DELTASONE) 20 MG tablet; 2 po at sametime daily for 5 days-  Dispense: 10 tablet; Refill: 0    The above assessment and management plan was discussed with the patient. The patient verbalized understanding of and has agreed to the management plan. Patient is aware to call the clinic if symptoms persist or worsen. Patient is aware when to return to the clinic for a follow-up visit. Patient educated on when it is appropriate to go to the emergency department.   Mary-Margaret Daphine Deutscher, FNP

## 2022-09-29 NOTE — Patient Instructions (Signed)
Acute Back Pain, Adult Acute back pain is sudden and usually short-lived. It is often caused by an injury to the muscles and tissues in the back. The injury may result from: A muscle, tendon, or ligament getting overstretched or torn. Ligaments are tissues that connect bones to each other. Lifting something improperly can cause a back strain. Wear and tear (degeneration) of the spinal disks. Spinal disks are circular tissue that provide cushioning between the bones of the spine (vertebrae). Twisting motions, such as while playing sports or doing yard work. A hit to the back. Arthritis. You may have a physical exam, lab tests, and imaging tests to find the cause of your pain. Acute back pain usually goes away with rest and home care. Follow these instructions at home: Managing pain, stiffness, and swelling Take over-the-counter and prescription medicines only as told by your health care provider. Treatment may include medicines for pain and inflammation that are taken by mouth or applied to the skin, or muscle relaxants. Your health care provider may recommend applying ice during the first 24-48 hours after your pain starts. To do this: Put ice in a plastic bag. Place a towel between your skin and the bag. Leave the ice on for 20 minutes, 2-3 times a day. Remove the ice if your skin turns bright red. This is very important. If you cannot feel pain, heat, or cold, you have a greater risk of damage to the area. If directed, apply heat to the affected area as often as told by your health care provider. Use the heat source that your health care provider recommends, such as a moist heat pack or a heating pad. Place a towel between your skin and the heat source. Leave the heat on for 20-30 minutes. Remove the heat if your skin turns bright red. This is especially important if you are unable to feel pain, heat, or cold. You have a greater risk of getting burned. Activity  Do not stay in bed. Staying in  bed for more than 1-2 days can delay your recovery. Sit up and stand up straight. Avoid leaning forward when you sit or hunching over when you stand. If you work at a desk, sit close to it so you do not need to lean over. Keep your chin tucked in. Keep your neck drawn back, and keep your elbows bent at a 90-degree angle (right angle). Sit high and close to the steering wheel when you drive. Add lower back (lumbar) support to your car seat, if needed. Take short walks on even surfaces as soon as you are able. Try to increase the length of time you walk each day. Do not sit, drive, or stand in one place for more than 30 minutes at a time. Sitting or standing for long periods of time can put stress on your back. Do not drive or use heavy machinery while taking prescription pain medicine. Use proper lifting techniques. When you bend and lift, use positions that put less stress on your back: Bend your knees. Keep the load close to your body. Avoid twisting. Exercise regularly as told by your health care provider. Exercising helps your back heal faster and helps prevent back injuries by keeping muscles strong and flexible. Work with a physical therapist to make a safe exercise program, as recommended by your health care provider. Do any exercises as told by your physical therapist. Lifestyle Maintain a healthy weight. Extra weight puts stress on your back and makes it difficult to have good   posture. Avoid activities or situations that make you feel anxious or stressed. Stress and anxiety increase muscle tension and can make back pain worse. Learn ways to manage anxiety and stress, such as through exercise. General instructions Sleep on a firm mattress in a comfortable position. Try lying on your side with your knees slightly bent. If you lie on your back, put a pillow under your knees. Keep your head and neck in a straight line with your spine (neutral position) when using electronic equipment like  smartphones or pads. To do this: Raise your smartphone or pad to look at it instead of bending your head or neck to look down. Put the smartphone or pad at the level of your face while looking at the screen. Follow your treatment plan as told by your health care provider. This may include: Cognitive or behavioral therapy. Acupuncture or massage therapy. Meditation or yoga. Contact a health care provider if: You have pain that is not relieved with rest or medicine. You have increasing pain going down into your legs or buttocks. Your pain does not improve after 2 weeks. You have pain at night. You lose weight without trying. You have a fever or chills. You develop nausea or vomiting. You develop abdominal pain. Get help right away if: You develop new bowel or bladder control problems. You have unusual weakness or numbness in your arms or legs. You feel faint. These symptoms may represent a serious problem that is an emergency. Do not wait to see if the symptoms will go away. Get medical help right away. Call your local emergency services (911 in the [place] Do not drive yourself to the hospital. Summary Acute back pain is sudden and usually short-lived. Use proper lifting techniques. When you bend and lift, use positions that put less stress on your back. Take over-the-counter and prescription medicines only as told by your health care provider, and apply heat or ice as told. This information is not intended to replace advice given to you by your health care provider. Make sure you discuss any questions you have with your health care provider. Document Revised: 04/12/2020 Document Reviewed: 04/12/2020 [employer] Patient Education  2024 [employer]  

## 2022-12-08 ENCOUNTER — Telehealth: Payer: Self-pay | Admitting: Family Medicine

## 2022-12-11 ENCOUNTER — Ambulatory Visit (INDEPENDENT_AMBULATORY_CARE_PROVIDER_SITE_OTHER): Payer: Managed Care, Other (non HMO) | Admitting: Family Medicine

## 2022-12-11 ENCOUNTER — Encounter: Payer: Self-pay | Admitting: Family Medicine

## 2022-12-11 VITALS — BP 129/72 | HR 88 | Temp 97.0°F | Ht 67.0 in | Wt 177.2 lb

## 2022-12-11 DIAGNOSIS — E663 Overweight: Secondary | ICD-10-CM

## 2022-12-11 DIAGNOSIS — F411 Generalized anxiety disorder: Secondary | ICD-10-CM

## 2022-12-11 DIAGNOSIS — R7303 Prediabetes: Secondary | ICD-10-CM | POA: Diagnosis not present

## 2022-12-11 DIAGNOSIS — E782 Mixed hyperlipidemia: Secondary | ICD-10-CM

## 2022-12-11 DIAGNOSIS — I10 Essential (primary) hypertension: Secondary | ICD-10-CM

## 2022-12-11 DIAGNOSIS — F325 Major depressive disorder, single episode, in full remission: Secondary | ICD-10-CM

## 2022-12-11 DIAGNOSIS — G8929 Other chronic pain: Secondary | ICD-10-CM

## 2022-12-11 DIAGNOSIS — M5441 Lumbago with sciatica, right side: Secondary | ICD-10-CM

## 2022-12-11 LAB — BAYER DCA HB A1C WAIVED: HB A1C (BAYER DCA - WAIVED): 5.6 % (ref 4.8–5.6)

## 2022-12-11 NOTE — Progress Notes (Signed)
Established Patient Office Visit  Subjective   Patient ID: Monica Elliott, female    DOB: 1960/10/31  Age: 62 y.o. MRN: [id number]  Chief Complaint  Patient presents with   Medical Management of Chronic Issues   Prediabetes   Hypertension    HPI HTN Complaint with meds - Yes Current Medications - lisinopril Exercising Regularly - No Pertinent ROS:  Headache - No Fatigue - No Visual Disturbances - No Chest pain - No Dyspnea - No Palpitations - No LE edema - No  2. HLD She takes liptior daily and omega 3 supplement daily. She eats regular diet. She has been eating more fruits and spinach lately.   3. Weight She has cut back on carb, switched to leaner meats. She has been going to the gym 2x a week for 1.5 hours. Doing cardio and light weights.   4. Chronic back pain Hx of ruptured disc. She has chronic lower back pain with radiating pain down her right thigh. She also has tingling in right toes. She denies saddle anesthesia changes, changes in gait, or changes in bowel or bladder control. Takes voltaren BID with moderate relief.   5. Depression/anxiety Not on medication. Symptoms manageable.      12/11/2022    8:46 AM 09/29/2022   10:57 AM 06/12/2022    1:46 PM  Depression screen PHQ 2/9  Decreased Interest 0 0 0  Down, Depressed, Hopeless 0 0 0  PHQ - 2 Score 0 0 0  Altered sleeping 1 0 0  Tired, decreased energy 0 1 0  Change in appetite 0 1 0  Feeling bad or failure about yourself  0 0 0  Trouble concentrating 0 0 0  Moving slowly or fidgety/restless 0 0 0  Suicidal thoughts 0 0 0  PHQ-9 Score 1 2 0  Difficult doing work/chores Not difficult at all Somewhat difficult Not difficult at all      12/11/2022    8:47 AM 09/29/2022   10:58 AM 06/12/2022    1:46 PM 03/12/2022    8:55 AM  GAD 7 : Generalized Anxiety Score  Nervous, Anxious, on Edge 0 0 0 0  Control/stop worrying 0 0 0 0  Worry too much - different things 0 0 0 0  Trouble relaxing 0 1 0 0   Restless 0 0 0 0  Easily annoyed or irritable 0 0 0 0  Afraid - awful might happen 0 0 0 0  Total GAD 7 Score 0 1 0 0  Anxiety Difficulty Not difficult at all Not difficult at all Not difficult at all Not difficult at all      Past Medical History:  Diagnosis Date   Anemia    states low iron   Depression    Hypercholesterolemia    Hypertension    Vitamin D deficiency       ROS As per HPI.    Objective:     BP 129/72   Pulse 88   Temp (!) 97 F (36.1 C) (Temporal)   Ht 5\' 7"  (1.702 m)   Wt 177 lb 4 oz (80.4 kg)   LMP 05/10/2011   SpO2 96%   BMI 27.76 kg/m   Wt Readings from Last 3 Encounters:  12/11/22 177 lb 4 oz (80.4 kg)  09/29/22 180 lb (81.6 kg)  06/25/22 181 lb (82.1 kg)    Physical Exam Vitals and nursing note reviewed.  Constitutional:      General: She is not in acute  distress.    Appearance: She is not ill-appearing, toxic-appearing or diaphoretic.  HENT:     Head: Normocephalic and atraumatic.  Neck:     Thyroid: No thyroid mass, thyromegaly or thyroid tenderness.  Cardiovascular:     Rate and Rhythm: Normal rate and regular rhythm.     Heart sounds: Normal heart sounds. No murmur heard. Pulmonary:     Effort: Pulmonary effort is normal. No respiratory distress.     Breath sounds: Normal breath sounds.  Musculoskeletal:     Cervical back: Neck supple. No rigidity.     Right lower leg: No edema.     Left lower leg: No edema.  Skin:    General: Skin is warm and dry.  Neurological:     General: No focal deficit present.     Mental Status: She is alert and oriented to person, place, and time.     Gait: Gait normal.  Psychiatric:        Mood and Affect: Mood normal.        Behavior: Behavior normal.        Thought Content: Thought content normal.        Judgment: Judgment normal.      No results found for any visits on 12/11/22.    The 10-year ASCVD risk score (Arnett DK, et al., 2019) is: 16%    Assessment & Plan:   Floris  was seen today for medical management of chronic issues, prediabetes and hypertension.  Diagnoses and all orders for this visit:  Primary hypertension Well controlled on current regimen.   Mixed hyperlipidemia On high dose statin. Last LDL 104. Diet, exercise, weight loss.   Prediabetes A1c is 5.6 today. Diet, exercise, weight loss. -     Bayer DCA Hb A1c Waived  Overweight Trending down. Diet, exercise.   Major depressive disorder with single episode, in full remission (HCC) Generalized anxiety disorder Well controlled with medication.   Chronic bilateral low back pain with right-sided sciatica Stable. No red flags. Continue voltaren prn.    Return in about 6 months (around 06/10/2023) for CPE.  The patient indicates understanding of these issues and agrees with the plan.  Gabriel Earing, FNP

## 2022-12-12 ENCOUNTER — Other Ambulatory Visit: Payer: Self-pay | Admitting: Family Medicine

## 2022-12-12 DIAGNOSIS — I10 Essential (primary) hypertension: Secondary | ICD-10-CM

## 2022-12-12 DIAGNOSIS — E782 Mixed hyperlipidemia: Secondary | ICD-10-CM

## 2022-12-12 DIAGNOSIS — G8929 Other chronic pain: Secondary | ICD-10-CM

## 2023-03-17 ENCOUNTER — Other Ambulatory Visit: Payer: Self-pay | Admitting: Family Medicine

## 2023-03-17 DIAGNOSIS — G8929 Other chronic pain: Secondary | ICD-10-CM

## 2023-04-23 ENCOUNTER — Ambulatory Visit: Admitting: Family

## 2023-04-23 ENCOUNTER — Ambulatory Visit: Payer: Self-pay

## 2023-04-23 VITALS — BP 107/65 | HR 112 | Temp 97.8°F | Ht 67.0 in | Wt 176.0 lb

## 2023-04-23 DIAGNOSIS — G8929 Other chronic pain: Secondary | ICD-10-CM

## 2023-04-23 DIAGNOSIS — M5441 Lumbago with sciatica, right side: Secondary | ICD-10-CM | POA: Diagnosis not present

## 2023-04-23 MED ORDER — BACLOFEN 10 MG PO TABS: 10.0000 mg | ORAL_TABLET | Freq: Three times a day (TID) | ORAL | 0 refills | Status: DC

## 2023-04-23 MED ORDER — METHYLPREDNISOLONE ACETATE 80 MG/ML IJ SUSP
80.0000 mg | Freq: Once | INTRAMUSCULAR | Status: AC
Start: 2023-04-23 — End: 2023-04-23
  Administered 2023-04-23: 80 mg via INTRAMUSCULAR

## 2023-04-23 MED ORDER — KETOROLAC TROMETHAMINE 60 MG/2ML IM SOLN
60.0000 mg | Freq: Once | INTRAMUSCULAR | Status: AC
Start: 2023-04-23 — End: 2023-04-23
  Administered 2023-04-23: 60 mg via INTRAMUSCULAR

## 2023-04-23 NOTE — Patient Instructions (Signed)

## 2023-04-23 NOTE — Telephone Encounter (Signed)
  Chief Complaint: low back pain Symptoms: pain, increased urinary frequency Frequency: worsening over the last two days Pertinent Negatives: Patient denies numbness, tingling, fever, urinary symptoms Disposition: [] ED /[] Urgent Care (no appt availability in office) / [x] Appointment(In office/virtual)/ []  [facility]/ [] Home Care/ [] Refused Recommended Disposition /[] [facility]/ []  Follow-up with PCP Additional Notes: Patient calls reporting worsening low back pain that is radiating down R leg x2 days. Patient reports hx of sciatica and states she has taken tylenol and prescription medications with no relief. Per protocol, patient to be evaluated within 4 hours. First available appointment with PCP is outside of guideline. Patient scheduled with first available provider in clinic for today at 1355. Care advice reviewed, patient verbalized understanding and denies further questions at this time. Alerting PCP for review.    Copied from CRM [id number]. Topic: Clinical - Red Word Triage >> Apr 23, 2023 11:19 AM Izetta Dakin wrote: [facility] that prompted transfer to Nurse Triage: Increased lower back pain with rt side pain Reason for Disposition  [1] SEVERE back pain (e.g., excruciating, unable to do any normal activities) AND [2] not improved 2 hours after pain medicine  Answer Assessment - Initial Assessment Questions 1. ONSET: "When did the pain begin?"      Worsening over the last two days 2. LOCATION: "Where does it hurt?" (upper, mid or lower back)     Low back pain, radiates down R leg 3. SEVERITY: "How bad is the pain?"  (e.g., Scale 1-10; mild, moderate, or severe)   - MILD (1-3): Doesn't interfere with normal activities.    - MODERATE (4-7): Interferes with normal activities or awakens from sleep.    - SEVERE (8-10): Excruciating pain, unable to do any normal activities.      10/10 4. PATTERN: "Is the pain constant?" (e.g., yes, no; constant, intermittent)       Constant 5. RADIATION: "Does the pain shoot into your legs or somewhere else?"     R leg 6. CAUSE:  "What do you think is causing the back pain?"      Sciatica 7. BACK OVERUSE:  "Any recent lifting of heavy objects, strenuous work or exercise?"     Denies 8. MEDICINES: "What have you taken so far for the pain?" (e.g., nothing, acetaminophen, NSAIDS)     Took tylenol this morning, did not help.  9. NEUROLOGIC SYMPTOMS: "Do you have any weakness, numbness, or problems with bowel/bladder control?"     Increased loss of bladder, completely urinating on self- states she has increased urgency and can feel when she has to go. 10. OTHER SYMPTOMS: "Do you have any other symptoms?" (e.g., fever, abdomen pain, burning with urination, blood in urine)       Denies  Protocols used: Back Pain-A-AH

## 2023-04-23 NOTE — Telephone Encounter (Signed)
 Noted.

## 2023-04-23 NOTE — Progress Notes (Signed)
 Subjective:    Patient ID: Monica Elliott, female    DOB: [birth year], 63 y.o.   MRN: [id number]  Chief Complaint  Patient presents with   Back Pain    Constant sees morgan    Pt presents to the office today with chronic back pain, but has flared up over the last two days.  Back Pain This is a chronic problem. The problem occurs constantly. The problem has been gradually worsening since onset. The pain is present in the lumbar spine. The quality of the pain is described as aching. The pain radiates to the right thigh, right knee and right foot. The pain is at a severity of 8/10. The pain is moderate. The pain is The same all the time. The symptoms are aggravated by bending and standing. Associated symptoms include leg pain, numbness and tingling. Risk factors include obesity. She has tried bed rest and NSAIDs for the symptoms. The treatment provided mild relief.      Review of Systems  Musculoskeletal:  Positive for back pain.  Neurological:  Positive for tingling and numbness.  All other systems reviewed and are negative.   Social History   Socioeconomic History   Marital status: Single    Spouse name: Not on file   Number of children: Not on file   Years of education: Not on file   Highest education level: 12th grade  Occupational History   Not on file  Tobacco Use   Smoking status: Every Day    Current packs/day: 0.75    Types: Cigarettes   Smokeless tobacco: Never  Vaping Use   Vaping status: Never Used  Substance and Sexual Activity   Alcohol use: Yes    Comment: social   Drug use: No   Sexual activity: Not Currently  Other Topics Concern   Not on file  Social History Narrative   Not on file   Social Drivers of Health   Financial Resource Strain: Low Risk  (04/23/2023)   Overall Financial Resource Strain (CARDIA)    Difficulty of Paying Living Expenses: Not hard at all  Food Insecurity: No Food Insecurity (04/23/2023)   Hunger Vital Sign    Worried About  Running Out of Food in the Last Year: Never true    Ran Out of Food in the Last Year: Never true  Transportation Needs: No Transportation Needs (04/23/2023)   PRAPARE - Administrator, Civil Service (Medical): No    Lack of Transportation (Non-Medical): No  Physical Activity: Insufficiently Active (04/23/2023)   Exercise Vital Sign    Days of Exercise per Week: 2 days    Minutes of Exercise per Session: 20 min  Stress: No Stress Concern Present (04/23/2023)   [employer] of Occupational Health - Occupational Stress Questionnaire    Feeling of Stress : Only a little  Social Connections: Moderately Isolated (04/23/2023)   Social Connection and Isolation Panel [NHANES]    Frequency of Communication with Friends and Family: More than three times a week    Frequency of Social Gatherings with Friends and Family: Three times a week    Attends Religious Services: More than 4 times per year    Active Member of Clubs or Organizations: No    Attends Banker Meetings: Not on file    Marital Status: Widowed   Family History  Problem Relation Age of Onset   Heart disease Mother    Hypertension Mother    Diabetes Mother  Hypertension Father    Diabetes Sister    Alcohol abuse Brother    Colon cancer Neg Hx    Breast cancer Neg Hx         Objective:   Physical Exam Vitals reviewed.  Constitutional:      General: She is not in acute distress.    Appearance: She is well-developed.  HENT:     Head: Normocephalic and atraumatic.  Eyes:     Pupils: Pupils are equal, round, and reactive to light.  Neck:     Thyroid: No thyromegaly.  Cardiovascular:     Rate and Rhythm: Normal rate and regular rhythm.     Heart sounds: Normal heart sounds. No murmur heard. Pulmonary:     Effort: Pulmonary effort is normal. No respiratory distress.     Breath sounds: Normal breath sounds. No wheezing.  Abdominal:     General: Bowel sounds are normal. There is no distension.      Palpations: Abdomen is soft.     Tenderness: There is no abdominal tenderness.  Musculoskeletal:        General: No tenderness. Normal range of motion.     Cervical back: Normal range of motion and neck supple.     Comments: Pain in lumbar with flexion and extension  Skin:    General: Skin is warm and dry.  Neurological:     Mental Status: She is alert and oriented to person, place, and time.     Cranial Nerves: No cranial nerve deficit.     Deep Tendon Reflexes: Reflexes are normal and symmetric.  Psychiatric:        Behavior: Behavior normal.        Thought Content: Thought content normal.        Judgment: Judgment normal.       BP 107/65   Pulse (!) 112   Temp 97.8 F (36.6 C) (Temporal)   Ht 5\' 7"  (1.702 m)   Wt 176 lb (79.8 kg)   LMP 05/10/2011   SpO2 97%   BMI 27.57 kg/m      Assessment & Plan:  Monica Elliott comes in today with chief complaint of Back Pain (Constant sees morgan )   Diagnosis and orders addressed:  1. Chronic bilateral low back pain with right-sided sciatica (Primary) Rest ROM exercises encouraged- Handout give Continue Diclofenac BID with food NO other NSAID's  Tylenol as needed Baclofen as needed, sedation precautions  Follow up if symptoms worsen or do not improve  - methylPREDNISolone acetate (DEPO-MEDROL) injection 80 mg - ketorolac (TORADOL) injection 60 mg - baclofen (LIORESAL) 10 MG tablet; Take 1 tablet (10 mg total) by mouth 3 (three) times daily.  Dispense: 60 each; Refill: 0   Jannifer Rodney, FNP

## 2023-05-19 ENCOUNTER — Other Ambulatory Visit: Payer: Self-pay | Admitting: Family Medicine

## 2023-05-19 DIAGNOSIS — I10 Essential (primary) hypertension: Secondary | ICD-10-CM

## 2023-06-08 ENCOUNTER — Other Ambulatory Visit: Payer: Self-pay

## 2023-06-08 ENCOUNTER — Telehealth: Payer: Self-pay

## 2023-06-08 DIAGNOSIS — Z0000 Encounter for general adult medical examination without abnormal findings: Secondary | ICD-10-CM

## 2023-06-08 DIAGNOSIS — I10 Essential (primary) hypertension: Secondary | ICD-10-CM

## 2023-06-08 DIAGNOSIS — E782 Mixed hyperlipidemia: Secondary | ICD-10-CM

## 2023-06-08 DIAGNOSIS — E663 Overweight: Secondary | ICD-10-CM

## 2023-06-08 DIAGNOSIS — E559 Vitamin D deficiency, unspecified: Secondary | ICD-10-CM

## 2023-06-08 DIAGNOSIS — R7303 Prediabetes: Secondary | ICD-10-CM

## 2023-06-08 DIAGNOSIS — D509 Iron deficiency anemia, unspecified: Secondary | ICD-10-CM

## 2023-06-08 DIAGNOSIS — R944 Abnormal results of kidney function studies: Secondary | ICD-10-CM

## 2023-06-08 NOTE — Telephone Encounter (Signed)
 Copied from CRM [id number]. Topic: Clinical - Request for Lab/Test Order >> Jun 08, 2023  9:12 AM Elle L wrote: Reason for CRM: The patient is requesting lab orders and a lab appointment before her appointment on 5/9. The patient's call back number is [phone].

## 2023-06-08 NOTE — Telephone Encounter (Signed)
 Done

## 2023-06-09 ENCOUNTER — Other Ambulatory Visit

## 2023-06-09 DIAGNOSIS — I10 Essential (primary) hypertension: Secondary | ICD-10-CM | POA: Diagnosis not present

## 2023-06-09 DIAGNOSIS — R944 Abnormal results of kidney function studies: Secondary | ICD-10-CM | POA: Diagnosis not present

## 2023-06-09 DIAGNOSIS — E663 Overweight: Secondary | ICD-10-CM

## 2023-06-09 DIAGNOSIS — E559 Vitamin D deficiency, unspecified: Secondary | ICD-10-CM | POA: Diagnosis not present

## 2023-06-09 DIAGNOSIS — E782 Mixed hyperlipidemia: Secondary | ICD-10-CM | POA: Diagnosis not present

## 2023-06-09 DIAGNOSIS — R7303 Prediabetes: Secondary | ICD-10-CM

## 2023-06-09 LAB — BAYER DCA HB A1C WAIVED: HB A1C (BAYER DCA - WAIVED): 5.8 % — ABNORMAL HIGH (ref 4.8–5.6)

## 2023-06-10 LAB — VITAMIN D 25 HYDROXY (VIT D DEFICIENCY, FRACTURES): Vit D, 25-Hydroxy: 41.2 ng/mL (ref 30.0–100.0)

## 2023-06-10 LAB — PSA, TOTAL AND FREE
PSA, Free: <0.02 ng/mL
Prostate Specific Ag, Serum: <0.1 ng/mL

## 2023-06-10 LAB — CMP14+EGFR
ALT: 46 IU/L — ABNORMAL HIGH (ref 0–32)
AST: 28 IU/L (ref 0–40)
Albumin: 4.4 g/dL (ref 3.9–4.9)
Alkaline Phosphatase: 76 IU/L (ref 44–121)
BUN/Creatinine Ratio: 19 (ref 12–28)
BUN: 22 mg/dL (ref 8–27)
Bilirubin Total: 0.2 mg/dL (ref 0.0–1.2)
CO2: 21 mmol/L (ref 20–29)
Calcium: 9.3 mg/dL (ref 8.7–10.3)
Chloride: 102 mmol/L (ref 96–106)
Creatinine, Ser: 1.14 mg/dL — ABNORMAL HIGH (ref 0.57–1.00)
Globulin, Total: 2.3 g/dL (ref 1.5–4.5)
Glucose: 84 mg/dL (ref 70–99)
Potassium: 4.6 mmol/L (ref 3.5–5.2)
Sodium: 139 mmol/L (ref 134–144)
Total Protein: 6.7 g/dL (ref 6.0–8.5)
eGFR: 54 mL/min/{1.73_m2} — ABNORMAL LOW (ref >59)

## 2023-06-10 LAB — LIPID PANEL
Chol/HDL Ratio: 3.6 ratio (ref 0.0–4.4)
Cholesterol, Total: 167 mg/dL (ref 100–199)
HDL: 46 mg/dL
LDL Chol Calc (NIH): 92 mg/dL (ref 0–99)
Triglycerides: 169 mg/dL — ABNORMAL HIGH (ref 0–149)
VLDL Cholesterol Cal: 29 mg/dL (ref 5–40)

## 2023-06-10 LAB — TSH: TSH: 1.15 u[IU]/mL (ref 0.450–4.500)

## 2023-06-11 ENCOUNTER — Encounter: Payer: Self-pay | Admitting: Family Medicine

## 2023-06-11 ENCOUNTER — Ambulatory Visit (INDEPENDENT_AMBULATORY_CARE_PROVIDER_SITE_OTHER): Payer: Managed Care, Other (non HMO) | Admitting: Family Medicine

## 2023-06-11 VITALS — BP 136/88 | HR 89 | Temp 97.9°F | Ht 67.0 in | Wt 172.0 lb

## 2023-06-11 DIAGNOSIS — M5441 Lumbago with sciatica, right side: Secondary | ICD-10-CM

## 2023-06-11 DIAGNOSIS — Z6826 Body mass index (BMI) 26.0-26.9, adult: Secondary | ICD-10-CM

## 2023-06-11 DIAGNOSIS — R944 Abnormal results of kidney function studies: Secondary | ICD-10-CM

## 2023-06-11 DIAGNOSIS — E782 Mixed hyperlipidemia: Secondary | ICD-10-CM

## 2023-06-11 DIAGNOSIS — I10 Essential (primary) hypertension: Secondary | ICD-10-CM

## 2023-06-11 DIAGNOSIS — G8929 Other chronic pain: Secondary | ICD-10-CM | POA: Diagnosis not present

## 2023-06-11 DIAGNOSIS — Z0000 Encounter for general adult medical examination without abnormal findings: Secondary | ICD-10-CM

## 2023-06-11 DIAGNOSIS — K649 Unspecified hemorrhoids: Secondary | ICD-10-CM | POA: Diagnosis not present

## 2023-06-11 DIAGNOSIS — E663 Overweight: Secondary | ICD-10-CM

## 2023-06-11 DIAGNOSIS — R7401 Elevation of levels of liver transaminase levels: Secondary | ICD-10-CM

## 2023-06-11 DIAGNOSIS — Z0001 Encounter for general adult medical examination with abnormal findings: Secondary | ICD-10-CM

## 2023-06-11 DIAGNOSIS — E559 Vitamin D deficiency, unspecified: Secondary | ICD-10-CM

## 2023-06-11 DIAGNOSIS — R7303 Prediabetes: Secondary | ICD-10-CM

## 2023-06-11 MED ORDER — PREDNISONE 20 MG PO TABS: 40.0000 mg | ORAL_TABLET | Freq: Every day | ORAL | 0 refills | Status: AC

## 2023-06-11 MED ORDER — METHYLPREDNISOLONE ACETATE 80 MG/ML IJ SUSP
80.0000 mg | Freq: Once | INTRAMUSCULAR | Status: AC
  Administered 2023-06-11: 80 mg via INTRAMUSCULAR

## 2023-06-11 MED ORDER — LISINOPRIL 20 MG PO TABS: 20.0000 mg | ORAL_TABLET | Freq: Every day | ORAL | 3 refills | Status: AC

## 2023-06-11 MED ORDER — ATORVASTATIN CALCIUM 80 MG PO TABS: 80.0000 mg | ORAL_TABLET | Freq: Every day | ORAL | 3 refills | Status: AC

## 2023-06-11 NOTE — Progress Notes (Signed)
 Complete physical exam  Patient: Monica Elliott   DOB: November 05, 1960   63 y.o. Female  MRN: [id number]  Subjective:     Chief Complaint  Patient presents with   Annual Exam    Monica Elliott is a 63 y.o. female who presents today for a complete physical exam. She reports consuming a general diet. The patient does not participate in regular exercise at present. She generally feels fairly well. She reports sleeping well. She does have additional problems to discuss today.   Having trouble with hemorrhoids. Seeing blood when wiping daily. Pain daily. Using preparation H prn without relief. GI mentioned having these removed after her last colonoscopy. She is interested in a referral back to GI to discuss.   Chronic back pain with bilateral sciatica is stable. Had a steroid injection with prednisone  burst a few months ago with some relief. She is not taking Voltaren  anymore because it didn't seem to help. Taking tylenol prn with some relief. No saddle anesthesia, weakness, changes in bowel or bladder control.   Most recent fall risk assessment:    06/11/2023   10:18 AM  Fall Risk   Falls in the past year? 0     Most recent depression screenings:    06/11/2023   10:18 AM 12/11/2022    8:46 AM  PHQ 2/9 Scores  PHQ - 2 Score 0 0  PHQ- 9 Score 1 1        Patient Care Team: Albertha Huger, FNP as PCP - General (Family Medicine) Riley Cheadle Windsor Hatcher, MD (Gastroenterology) Chase Copping, NT as Technician (Internal Medicine)   Outpatient Medications Prior to Visit  Medication Sig   acetaminophen (TYLENOL) 500 MG tablet Take 1,000 mg by mouth every 6 (six) hours as needed for moderate pain.   Ascorbic Acid (VITAMIN C) 1000 MG tablet Take 1,000 mg by mouth daily.   atorvastatin  (LIPITOR) 80 MG tablet TAKE 1 TABLET BY MOUTH EVERY DAY   baclofen  (LIORESAL ) 10 MG tablet Take 1 tablet (10 mg total) by mouth 3 (three) times daily.   cholecalciferol (VITAMIN D3) 25 MCG (1000 UNIT) tablet  Take 1,000 Units by mouth daily.   diclofenac  (VOLTAREN ) 75 MG EC tablet TAKE 1 TABLET BY MOUTH TWICE A DAY   lisinopril  (ZESTRIL ) 20 MG tablet TAKE 1 TABLET BY MOUTH EVERY DAY   Omega-3 Fatty Acids (FISH OIL) 500 MG CAPS Take 500 mg by mouth daily.   OVER THE COUNTER MEDICATION Take 1 tablet by mouth daily. Nervive otc supplement   Turmeric 500 MG CAPS Take 500 mg by mouth daily.   vitamin B-12 (CYANOCOBALAMIN) 500 MCG tablet Take 500 mcg by mouth daily.   No facility-administered medications prior to visit.    ROS Negative unless specially indicated above in HPI.       Objective:     BP 136/88   Pulse 89   Temp 97.9 F (36.6 C) (Temporal)   Ht 5\' 7"  (1.702 m)   Wt 172 lb (78 kg)   LMP 05/10/2011   SpO2 97%   BMI 26.94 kg/m  Wt Readings from Last 3 Encounters:  06/11/23 172 lb (78 kg)  04/23/23 176 lb (79.8 kg)  12/11/22 177 lb 4 oz (80.4 kg)      Physical Exam Vitals and nursing note reviewed.  Constitutional:      General: She is not in acute distress.    Appearance: Normal appearance. She is not ill-appearing.  HENT:  Head: Normocephalic.     Right Ear: Tympanic membrane, ear canal and external ear normal.     Left Ear: Tympanic membrane, ear canal and external ear normal.     Nose: Nose normal.     Mouth/Throat:     Mouth: Mucous membranes are moist.     Pharynx: Oropharynx is clear.  Eyes:     Extraocular Movements: Extraocular movements intact.     Conjunctiva/sclera: Conjunctivae normal.     Pupils: Pupils are equal, round, and reactive to light.  Neck:     Thyroid: No thyroid mass, thyromegaly or thyroid tenderness.  Cardiovascular:     Rate and Rhythm: Normal rate and regular rhythm.     Pulses: Normal pulses.     Heart sounds: Normal heart sounds. No murmur heard.    No friction rub. No gallop.  Pulmonary:     Effort: Pulmonary effort is normal.     Breath sounds: Normal breath sounds.  Abdominal:     General: Bowel sounds are normal.  There is no distension.     Palpations: Abdomen is soft. There is no mass.     Tenderness: There is no abdominal tenderness. There is no guarding.  Musculoskeletal:     Right lower leg: No edema.     Left lower leg: No edema.  Skin:    General: Skin is warm and dry.     Capillary Refill: Capillary refill takes less than 2 seconds.     Findings: No lesion or rash.  Neurological:     General: No focal deficit present.     Mental Status: She is alert and oriented to person, place, and time.     Cranial Nerves: No cranial nerve deficit.     Motor: No weakness.     Gait: Gait normal.  Psychiatric:        Mood and Affect: Mood normal.        Behavior: Behavior normal.        Thought Content: Thought content normal.        Judgment: Judgment normal.      No results found for any visits on 06/11/23.     Assessment & Plan:    Routine Health Maintenance and Physical Exam  Zyriana was seen today for annual exam.  Diagnoses and all orders for this visit:  Routine general medical examination at a health care facility  Primary hypertension Well controlled on current regimen.  -     lisinopril  (ZESTRIL ) 20 MG tablet; Take 1 tablet (20 mg total) by mouth daily.  Vitamin D  deficiency Recent level as normal.   Mixed hyperlipidemia LDL at goal. Continue statin.  -     atorvastatin  (LIPITOR) 80 MG tablet; Take 1 tablet (80 mg total) by mouth daily.  Prediabetes A1c at 5.8. Diet, exercise, weight loss.   Overweight Trending down.   Elevated ALT measurement No symptoms. Will repeat in 4 weeks.  -     CMP14+EGFR; Future  Decreased GFR Increase hydration. Repeat in 4 weeks. Avoid NSAIDs.  -     CMP14+EGFR; Future  Chronic bilateral low back pain with right-sided sciatica Stable. No red flags. Steroid IM injection today. Start prednisone  burst tomorrow.  -     methylPREDNISolone  acetate (DEPO-MEDROL ) injection 80 mg -     predniSONE  (DELTASONE ) 20 MG tablet; Take 2 tablets (40  mg total) by mouth daily with breakfast for 5 days. Start tomorrow.  Hemorrhoids, unspecified hemorrhoid type Referral to GI to discuss  procedures.  -     Ambulatory referral to Gastroenterology    Immunization History  Administered Date(s) Administered   PFIZER(Purple Top)SARS-COV-2 Vaccination 06/23/2019, 07/14/2019, 01/30/2020, 02/20/2020   Tdap 03/22/2020    Health Maintenance  Topic Date Due   HIV Screening  06/12/2023 (Originally 07/04/1973)   COVID-19 Vaccine (5 - 2024-25 season) 12/27/2023 (Originally 10/04/2022)   Pneumococcal Vaccine 60-41 Years old (1 of 2 - PCV) 06/10/2024 (Originally 07/04/1977)   Zoster Vaccines- Shingrix (1 of 2) 09/10/2024 (Originally 07/04/2008)   INFLUENZA VACCINE  09/03/2023   MAMMOGRAM  03/25/2024   Cervical Cancer Screening (HPV/Pap Cotest)  03/22/2025   DTaP/Tdap/Td (2 - Td or Tdap) 03/22/2030   Colonoscopy  06/05/2031   Hepatitis C Screening  Completed   HPV VACCINES  Aged Out   Meningococcal B Vaccine  Aged Out    Discussed health benefits of physical activity, and encouraged her to engage in regular exercise appropriate for her age and condition.  Problem List Items Addressed This Visit       Cardiovascular and Mediastinum   Essential hypertension   Relevant Medications   lisinopril  (ZESTRIL ) 20 MG tablet   atorvastatin  (LIPITOR) 80 MG tablet     Nervous and Auditory   Chronic bilateral low back pain with right-sided sciatica   Relevant Medications   predniSONE  (DELTASONE ) 20 MG tablet     Other   Vitamin D  deficiency   Mixed hyperlipidemia   Relevant Medications   lisinopril  (ZESTRIL ) 20 MG tablet   atorvastatin  (LIPITOR) 80 MG tablet   Prediabetes   Overweight   Other Visit Diagnoses       Routine general medical examination at a health care facility    -  Primary     Elevated ALT measurement       Relevant Orders   CMP14+EGFR     Decreased GFR       Relevant Orders   CMP14+EGFR     Hemorrhoids, unspecified hemorrhoid  type       Relevant Medications   lisinopril  (ZESTRIL ) 20 MG tablet   atorvastatin  (LIPITOR) 80 MG tablet   Other Relevant Orders   Ambulatory referral to Gastroenterology      Return in about 6 months (around 12/12/2023) for chronic follow up, 4 weeks labs only appt.   The patient indicates understanding of these issues and agrees with the plan.  Albertha Huger, FNP

## 2023-06-11 NOTE — Patient Instructions (Signed)

## 2023-06-15 ENCOUNTER — Other Ambulatory Visit: Payer: Self-pay | Admitting: Family

## 2023-06-15 DIAGNOSIS — G8929 Other chronic pain: Secondary | ICD-10-CM

## 2023-06-22 ENCOUNTER — Other Ambulatory Visit: Payer: Self-pay | Admitting: Family Medicine

## 2023-06-22 DIAGNOSIS — G8929 Other chronic pain: Secondary | ICD-10-CM

## 2023-07-07 ENCOUNTER — Other Ambulatory Visit

## 2023-07-07 DIAGNOSIS — R944 Abnormal results of kidney function studies: Secondary | ICD-10-CM

## 2023-07-07 DIAGNOSIS — R7401 Elevation of levels of liver transaminase levels: Secondary | ICD-10-CM

## 2023-07-07 LAB — CMP14+EGFR
ALT: 45 IU/L — ABNORMAL HIGH (ref 0–32)
AST: 26 IU/L (ref 0–40)
Albumin: 4.5 g/dL (ref 3.9–4.9)
Alkaline Phosphatase: 75 IU/L (ref 44–121)
BUN/Creatinine Ratio: 19 (ref 12–28)
BUN: 17 mg/dL (ref 8–27)
Bilirubin Total: <0.2 mg/dL (ref 0.0–1.2)
CO2: 19 mmol/L — ABNORMAL LOW (ref 20–29)
Calcium: 9.2 mg/dL (ref 8.7–10.3)
Chloride: 104 mmol/L (ref 96–106)
Creatinine, Ser: 0.91 mg/dL (ref 0.57–1.00)
Globulin, Total: 2.2 g/dL (ref 1.5–4.5)
Glucose: 99 mg/dL (ref 70–99)
Potassium: 4.3 mmol/L (ref 3.5–5.2)
Sodium: 140 mmol/L (ref 134–144)
Total Protein: 6.7 g/dL (ref 6.0–8.5)
eGFR: 70 mL/min/{1.73_m2} (ref >59)

## 2023-07-08 ENCOUNTER — Ambulatory Visit: Payer: Self-pay | Admitting: Family Medicine

## 2023-07-12 ENCOUNTER — Other Ambulatory Visit: Payer: Self-pay | Admitting: Family Medicine

## 2023-07-12 ENCOUNTER — Ambulatory Visit
Admission: RE | Admit: 2023-07-12 | Discharge: 2023-07-12 | Disposition: A | Discharge status: Home / Self Care | Source: Ambulatory Visit | Attending: Family Medicine

## 2023-07-12 DIAGNOSIS — Z1231 Encounter for screening mammogram for malignant neoplasm of breast: Secondary | ICD-10-CM

## 2023-07-20 ENCOUNTER — Ambulatory Visit: Admitting: Gastroenterology

## 2023-07-31 ENCOUNTER — Other Ambulatory Visit: Payer: Self-pay | Admitting: Family

## 2023-07-31 DIAGNOSIS — G8929 Other chronic pain: Secondary | ICD-10-CM

## 2023-08-04 ENCOUNTER — Other Ambulatory Visit: Payer: Self-pay | Admitting: Family Medicine

## 2023-08-04 DIAGNOSIS — G8929 Other chronic pain: Secondary | ICD-10-CM

## 2023-08-10 NOTE — Progress Notes (Unsigned)
 GI Office Note    Referring Provider: Joesph Annabella HERO, FNP Primary Care Physician:  Joesph Annabella HERO, FNP Primary Gastroenterologist: Lamar HERO.Rourk, MD  Date:  08/11/2023  ID:  Monica Elliott, DOB [birth year], MRN [id number]  Chief Complaint   Chief Complaint  Patient presents with   Follow-up    Follow up on hemorrhoids. Some tenderness (little pain there).   History of Present Illness  Monica Elliott is a 63 y.o. female with a history of HTN, hypercholesteremia, depression, vitamin D  deficiency presenting today with complaint of hemorrhoids.  Colonoscopy 2013:  - Pancolonic diverticulosis - Single diminutive polyp at the ileocecal valve and sigmoid colon - Pathology revealed polypoid colonic mucosa and prolapse type polyp.  Last office visit April 2023 with Dr. Shaaron.  Presented to discuss hematochezia and consideration of obtaining colonoscopy.  Sometimes having about 5 bowel movements per day and going after meals.  Sometimes pain relieved after bowel movement but other times not.  Never seen a lot of blood in the toilet bowl.  Denied gross hematochezia.  Abdominal cramping is Rehman bowel movement time.  Denied any upper GI symptoms.  Good appetite.  She reported history of iron deficiency anemia and had been on iron supplementation daily for a long time. Suspected rectal bleeding likely anorectal in origin.  Also suspected intermittent abdominal cramping and bowel movements related to meals was possibly IBS.  Did not describe any out right diarrhea. She was offered a diagnostic colonoscopy.  He recommended stopping iron supplementation from GI standpoint.  Colonoscopy May 2023: - Diverticulosis in the entire examined colon.  - One 5 mm polyp at the splenic flexure (hyperplastic) - Non- bleeding internal hemorrhoids.  - The examination was otherwise normal on direct and retroflexion views. Patient likely bleeding from hemorrhoids. Would be a reasonably good hemorrhoid banding  candidate.  Advised to begin Benefiber.  - Follow up in 10 years.   Today:  Feels like something is hanging. Sometime feels the tissue and sometimes dont. Sometimes burns but never itches. Sometimes jsut painful has more pain felt externally when the tissue is exposed. Has bleeding recently as well.  Sometimes there is blood in the commode and sometimes it is on the toilet tissue. No clots and it is a bright red.   Preparation H - using it 4 days a week about once per day. Will also use the cloth. It helps with the burning.   Soemtism she has a good BM and sometims she has to sit there for ahwile. Does take iron. Most of the time its normal. Will sit for a long time. Soemtiems will take a stool softener. Has a senstiive stomach and depends on her diet.   Wt Readings from Last 5 Encounters:  08/11/23 177 lb (80.3 kg)  06/11/23 172 lb (78 kg)  04/23/23 176 lb (79.8 kg)  12/11/22 177 lb 4 oz (80.4 kg)  09/29/22 180 lb (81.6 kg)    Current Outpatient Medications  Medication Sig Dispense Refill   acetaminophen (TYLENOL) 500 MG tablet Take 1,000 mg by mouth every 6 (six) hours as needed for moderate pain.     Ascorbic Acid (VITAMIN C) 1000 MG tablet Take 1,000 mg by mouth daily.     atorvastatin  (LIPITOR) 80 MG tablet Take 1 tablet (80 mg total) by mouth daily. 90 tablet 3   cholecalciferol (VITAMIN D3) 25 MCG (1000 UNIT) tablet Take 1,000 Units by mouth daily.     lisinopril  (ZESTRIL ) 20 MG tablet Take  1 tablet (20 mg total) by mouth daily. 90 tablet 3   Omega-3 Fatty Acids (FISH OIL) 500 MG CAPS Take 500 mg by mouth daily.     Turmeric 500 MG CAPS Take 500 mg by mouth daily.     vitamin B-12 (CYANOCOBALAMIN ) 500 MCG tablet Take 500 mcg by mouth daily.     baclofen  (LIORESAL ) 10 MG tablet TAKE 1 TABLET BY MOUTH THREE TIMES A DAY (Patient not taking: Reported on 08/11/2023) 270 tablet 0   OVER THE COUNTER MEDICATION Take 1 tablet by mouth daily. Nervive otc supplement     No current  facility-administered medications for this visit.    Past Medical History:  Diagnosis Date   Anemia    states low iron   Depression    Hypercholesterolemia    Hypertension    Vitamin D  deficiency     Past Surgical History:  Procedure Laterality Date   BACK SURGERY  2016   BREAST BIOPSY Left    COLONOSCOPY  04/06/2011   Colonic diverticulosis and colonic polyps-removed as described above. Polyps benign. Next colonoscopy 04/2021.   COLONOSCOPY WITH PROPOFOL  N/A 06/04/2021   Procedure: COLONOSCOPY WITH PROPOFOL ;  Surgeon: Shaaron Lamar HERO, MD;  [facility];  Service: Endoscopy;  Laterality: N/A;  9:45am   POLYPECTOMY  06/04/2021   Procedure: POLYPECTOMY;  Surgeon: Shaaron Lamar HERO, MD;  Location: [facility];  Service: Endoscopy;;   TUBAL LIGATION      Family History  Problem Relation Age of Onset   Heart disease Mother    Hypertension Mother    Diabetes Mother    Hypertension Father    Diabetes Sister    Alcohol abuse Brother    Colon cancer Neg Hx    Breast cancer Neg Hx     Allergies as of 08/11/2023   (No Known Allergies)    Social History   Socioeconomic History   Marital status: Single    Spouse name: Not on file   Number of children: Not on file   Years of education: Not on file   Highest education level: 12th grade  Occupational History   Not on file  Tobacco Use   Smoking status: Every Day    Current packs/day: 0.75    Types: Cigarettes   Smokeless tobacco: Never  Vaping Use   Vaping status: Never Used  Substance and Sexual Activity   Alcohol use: Yes    Comment: social   Drug use: No   Sexual activity: Not Currently  Other Topics Concern   Not on file  Social History Narrative   Not on file   Social Drivers of Health   Financial Resource Strain: Low Risk  (04/23/2023)   Overall Financial Resource Strain (CARDIA)    Difficulty of Paying Living Expenses: Not hard at all  Food Insecurity: No Food Insecurity (04/23/2023)   Hunger  Vital Sign    Worried About Running Out of Food in the Last Year: Never true    Ran Out of Food in the Last Year: Never true  Transportation Needs: No Transportation Needs (04/23/2023)   PRAPARE - Administrator, Civil Service (Medical): No    Lack of Transportation (Non-Medical): No  Physical Activity: Insufficiently Active (04/23/2023)   Exercise Vital Sign    Days of Exercise per Week: 2 days    Minutes of Exercise per Session: 20 min  Stress: No Stress Concern Present (04/23/2023)   [employer] of Occupational Health - Occupational  Stress Questionnaire    Feeling of Stress : Only a little  Social Connections: Moderately Isolated (04/23/2023)   Social Connection and Isolation Panel    Frequency of Communication with Friends and Family: More than three times a week    Frequency of Social Gatherings with Friends and Family: Three times a week    Attends Religious Services: More than 4 times per year    Active Member of Clubs or Organizations: No    Attends Banker Meetings: Not on file    Marital Status: Widowed     Review of Systems   Gen: Denies fever, chills, anorexia. Denies fatigue, weakness, weight loss.  CV: Denies chest pain, palpitations, syncope, peripheral edema, and claudication. Resp: Denies dyspnea at rest, cough, wheezing, coughing up blood, and pleurisy. GI: See HPI Derm: Denies rash, itching, dry skin Psych: Denies depression, anxiety, memory loss, confusion. No homicidal or suicidal ideation.  Heme: Denies bruising, bleeding, and enlarged lymph nodes.  Physical Exam   BP 138/85   Pulse 93   Temp 98.6 F (37 C)   Ht 5' 7 (1.702 m)   Wt 177 lb (80.3 kg)   LMP 05/10/2011   BMI 27.72 kg/m   General:   Alert and oriented. No distress noted. Pleasant and cooperative.  Head:  Normocephalic and atraumatic. Eyes:  Conjuctiva clear without scleral icterus. Mouth:  Oral mucosa pink and moist. Good dentition. No lesions. Rectal:  small external skin tags, mild mucosal prolapse anteriorly, palpable hemorrhoid tissue to left lateral column.  Msk:  Symmetrical without gross deformities. Normal posture. Extremities:  Without edema. Neurologic:  Alert and  oriented x4 Psych:  Alert and cooperative. Normal mood and affect.  Assessment  Monica Elliott is a 63 y.o. female presenting today with hemorrhoids.  Hemorrhoids, rectal bleeding: - Having occasional BRBPR with bowel movements and some bright red blood in the toilet bowl without clots or overt rectal pain -Colonoscopy in 2023 with evidence of internal hemorrhoids.  At that time was advised to get a banding candidate. - Discussed hemorrhoid banding procedure today in depth, would likely benefit given the rectal bleeding and burning - Hemorrhoids grade 2 given they prolapse and retreat independently.  - Has had some mild relief of symptoms Preparation H and prescribed Anusol  by PCP although currently out. -Rectal exam today confirms amount of tissue prolapse as well as a palpable hemorrhoid tissue to the left lateral column where she usually experiences some burning discomfort. - Discussed avoidance of long toilet time and potential use for squatty potty for constipation - Advised stool softener 2-3 days a week and high-fiber diet - Iron likely exacerbating some constipation leading to worsening hemorrhoids - Advised consistent use of over-the-counter hemorrhoid cream and encouraged her to make a banding appointment if she would like, provided handout to her today and encouraged her to ask any follow-up questions as they arise.  We discussed other than surgical management there are limited options in this area for treatment of hemorrhoids.   PLAN   Anusol  or preparation H BID for 10-14 days. Anusol  refilled.  Avoid constipation, advised stool softener a few days a week Continue benefiber Continue iron Hemorrhoid banding pamphlet provided.  Follow up 3 months or  sooner for banding.    Charmaine Melia, MSN, FNP-BC, AGACNP-BC [facility]

## 2023-08-11 ENCOUNTER — Encounter: Payer: Self-pay | Admitting: Gastroenterology

## 2023-08-11 ENCOUNTER — Ambulatory Visit (INDEPENDENT_AMBULATORY_CARE_PROVIDER_SITE_OTHER): Admitting: Gastroenterology

## 2023-08-11 VITALS — BP 138/85 | HR 93 | Temp 98.6°F | Ht 67.0 in | Wt 177.0 lb

## 2023-08-11 DIAGNOSIS — K921 Melena: Secondary | ICD-10-CM

## 2023-08-11 DIAGNOSIS — K625 Hemorrhage of anus and rectum: Secondary | ICD-10-CM | POA: Diagnosis not present

## 2023-08-11 DIAGNOSIS — K641 Second degree hemorrhoids: Secondary | ICD-10-CM

## 2023-08-11 MED ORDER — HYDROCORTISONE (PERIANAL) 2.5 % EX CREA: 1.0000 | TOPICAL_CREAM | Freq: Two times a day (BID) | CUTANEOUS | 1 refills | Status: AC

## 2023-08-11 NOTE — Patient Instructions (Signed)
 Please use Anusol  or Preparation H twice a day consistently for 10-14 days to see if this allows for any shrinkage of your hemorrhoid and stops bleeding.  Continue to avoid constipation.  Please limit toilet time to less than 5 minutes.  Avoid straining.  Continue fiber supplementation and high-fiber diet.  Ensure you are getting at least 64 ounces of water  daily.  As we discussed you may use a stool softener 2-3 times weekly.  Continue daily iron supplementation.  Please continue to look over the hemorrhoid banding pamphlet and if you decide that she would like to proceed please feel free to call the office for banding appointment.  In general we will follow-up in 3 months, if you have any worsening symptoms please call the office.  It was a pleasure to see you today. I want to create trusting relationships with patients. If you receive a survey regarding your visit,  I greatly appreciate you taking time to fill this out on paper or through your MyChart. I value your feedback.  Charmaine Melia, MSN, FNP-BC, AGACNP-BC [facility]

## 2023-10-08 ENCOUNTER — Ambulatory Visit: Payer: Self-pay

## 2023-10-08 ENCOUNTER — Telehealth: Admitting: Family Medicine

## 2023-10-08 DIAGNOSIS — J40 Bronchitis, not specified as acute or chronic: Secondary | ICD-10-CM

## 2023-10-08 MED ORDER — AZITHROMYCIN 250 MG PO TABS
ORAL_TABLET | ORAL | 0 refills | Status: DC
Start: 2023-10-08 — End: 2023-10-22

## 2023-10-08 NOTE — Progress Notes (Signed)
 Subjective:    Patient ID: Monica Elliott, female    DOB: [birth year], 63 y.o.   MRN: [id number]  HPI Patient is being seen today as a video visit.  She is currently in her car.  I am currently in my office.  She consents to be seen via video.  Patient is a 63 year old African-American female with a history of hypertension and hyperlipidemia.  She also has a history of smoking.  She states that for more than 7 days she has had a cough productive of green and white sputum.  She denies fever.  She denies pleurisy or chest pain.  She does have some mild shortness of breath.  At night she hears herself wheezing and she also hears audible congestion in her chest.  This is getting worse.  She has some mild rhinorrhea.  She denies any sore throat.  She was unable to see her PCP today.  She is currently taking Coricidin.  She has no history of drug allergies.  Video visit began at 1028.  I concluded charting at 1040. Past Medical History:  Diagnosis Date   Anemia    states low iron   Depression    Hypercholesterolemia    Hypertension    Vitamin D  deficiency    Past Surgical History:  Procedure Laterality Date   BACK SURGERY  2016   BREAST BIOPSY Left    COLONOSCOPY  04/06/2011   Colonic diverticulosis and colonic polyps-removed as described above. Polyps benign. Next colonoscopy 04/2021.   COLONOSCOPY WITH PROPOFOL  N/A 06/04/2021   Procedure: COLONOSCOPY WITH PROPOFOL ;  Surgeon: Shaaron Lamar HERO, MD;  [facility];  Service: Endoscopy;  Laterality: N/A;  9:45am   POLYPECTOMY  06/04/2021   Procedure: POLYPECTOMY;  Surgeon: Shaaron Lamar HERO, MD;  [facility];  Service: Endoscopy;;   TUBAL LIGATION     Current Outpatient Medications on File Prior to Visit  Medication Sig Dispense Refill   acetaminophen (TYLENOL) 500 MG tablet Take 1,000 mg by mouth every 6 (six) hours as needed for moderate pain.     Ascorbic Acid (VITAMIN C) 1000 MG tablet Take 1,000 mg by mouth daily.      atorvastatin  (LIPITOR) 80 MG tablet Take 1 tablet (80 mg total) by mouth daily. 90 tablet 3   baclofen  (LIORESAL ) 10 MG tablet TAKE 1 TABLET BY MOUTH THREE TIMES A DAY (Patient not taking: Reported on 08/11/2023) 270 tablet 0   cholecalciferol (VITAMIN D3) 25 MCG (1000 UNIT) tablet Take 1,000 Units by mouth daily.     hydrocortisone  (ANUSOL -HC) 2.5 % rectal cream Place 1 Application rectally 2 (two) times daily. 30 g 1   lisinopril  (ZESTRIL ) 20 MG tablet Take 1 tablet (20 mg total) by mouth daily. 90 tablet 3   Omega-3 Fatty Acids (FISH OIL) 500 MG CAPS Take 500 mg by mouth daily.     OVER THE COUNTER MEDICATION Take 1 tablet by mouth daily. Nervive otc supplement     Turmeric 500 MG CAPS Take 500 mg by mouth daily.     vitamin B-12 (CYANOCOBALAMIN ) 500 MCG tablet Take 500 mcg by mouth daily.     No current facility-administered medications on file prior to visit.   No Known Allergies Social History   Socioeconomic History   Marital status: Single    Spouse name: Not on file   Number of children: Not on file   Years of education: Not on file   Highest education level: 12th grade  Occupational History   Not on file  Tobacco Use   Smoking status: Every Day    Current packs/day: 0.75    Types: Cigarettes   Smokeless tobacco: Never  Vaping Use   Vaping status: Never Used  Substance and Sexual Activity   Alcohol use: Yes    Comment: social   Drug use: No   Sexual activity: Not Currently  Other Topics Concern   Not on file  Social History Narrative   Not on file   Social Drivers of Health   Financial Resource Strain: Low Risk  (04/23/2023)   Overall Financial Resource Strain (CARDIA)    Difficulty of Paying Living Expenses: Not hard at all  Food Insecurity: No Food Insecurity (04/23/2023)   Hunger Vital Sign    Worried About Running Out of Food in the Last Year: Never true    Ran Out of Food in the Last Year: Never true  Transportation Needs: No Transportation Needs (04/23/2023)    PRAPARE - Administrator, Civil Service (Medical): No    Lack of Transportation (Non-Medical): No  Physical Activity: Insufficiently Active (04/23/2023)   Exercise Vital Sign    Days of Exercise per Week: 2 days    Minutes of Exercise per Session: 20 min  Stress: No Stress Concern Present (04/23/2023)   [employer] of Occupational Health - Occupational Stress Questionnaire    Feeling of Stress : Only a little  Social Connections: Moderately Isolated (04/23/2023)   Social Connection and Isolation Panel    Frequency of Communication with Friends and Family: More than three times a week    Frequency of Social Gatherings with Friends and Family: Three times a week    Attends Religious Services: More than 4 times per year    Active Member of Clubs or Organizations: No    Attends Banker Meetings: Not on file    Marital Status: Widowed  Intimate Partner Violence: Not on file       Review of Systems  All other systems reviewed and are negative.      Objective:   Physical Exam   Physical exam was limited to what I could witness on video.  The patient was not in respiratory distress.  There was no increased work of breathing.  She was speaking full and complete sentences while driving.     Assessment & Plan:  Bronchitis Difficult to diagnose over video visit.  Recommended that she see her PCP next week if not improving.  I believe the patient likely has bronchitis complicated by smoking.  Given the purulent sputum and worsening symptoms I will add a Z-Pak to cover bacterial bronchitis.  Patient may require prednisone  and albuterol if worsening.  Recommend she follow-up with her PCP next week if not improving.

## 2023-10-08 NOTE — Telephone Encounter (Signed)
 This encounter was created in error - please disregard.   See nurse triage dated today.

## 2023-10-08 NOTE — Telephone Encounter (Signed)
Patient is scheduled at another office

## 2023-10-08 NOTE — Telephone Encounter (Signed)
 1st attempt, no answer. Left voicemail for patient to return call for nurse triage.   Patient was seen with [facility] Dr Duanne via telehealth visit this morning. She was told she needs an in person evaluation, scheduled for 10/11/23. Patient would like to know what she can take OTC for her cough.      Copied from CRM [id number]. Topic: Clinical - Medical Advice >> Oct 08, 2023 10:37 AM Travis F wrote: Reason for CRM: Patient is calling in because she had a telehealth visit and was told she needed to be seen in person so someone could listen to her chest. Patient scheduled an appointment for 09/08 but wanted to know if there is something she can take over the counter for her cough in the meantime.

## 2023-10-08 NOTE — Telephone Encounter (Signed)
 FYI Only or Action Required?: FYI only for provider.  Patient was last seen in primary care on 06/11/2023 by Joesph Annabella HERO, FNP.  Called Nurse Triage reporting Wheezing.  Symptoms began several days ago.  Interventions attempted: OTC medications: cold medicine.  Symptoms are: unchanged.  Triage Disposition: See Today in Office  Patient/caregiver understands and will follow disposition?: Yes - pt wants to see provider for medication.                     from CRM #[id number]. Topic: Clinical - Red Word Triage >> Oct 08, 2023  9:02 AM Monica Elliott wrote: Monica Elliott that prompted transfer to Nurse Triage: pt available After 1PM - unsure if allergies. runny nose sneezing. and real bad coughing. unable to get rid of it.  wheezing and rumbling in the chest. mildly thightness. Answer Assessment - Initial Assessment Questions 1. RESPIRATORY STATUS: Describe your breathing? (e.g., wheezing, shortness of breath, unable to speak, severe coughing)      Wheezing - rattling in chest 2. ONSET: When did this breathing problem begin?      2-3 days 3. PATTERN Does the difficult breathing come and go, or has it been constant since it started?      constant 4. SEVERITY: How bad is your breathing? (e.g., mild, moderate, severe)      None - shallow  6. CARDIAC HISTORY: Do you have any history of heart disease? (e.g., heart attack, angina, bypass surgery, angioplasty)      no 7. LUNG HISTORY: Do you have any history of lung disease?  (e.g., pulmonary embolus, asthma, emphysema)     no 8. CAUSE: What do you think is causing the breathing problem?      URI 9. OTHER SYMPTOMS: Do you have any other symptoms? (e.g., chest pain, cough, dizziness, fever, runny nose)     Runny nose  Protocols used: Breathing Difficulty-A-AH

## 2023-10-08 NOTE — Telephone Encounter (Signed)
 FYI Only or Action Required?: Action required by provider: clinical question for provider.  Patient was last seen in primary care on 10/08/2023 by Duanne Butler DASEN, MD.  Called Nurse Triage reporting Wheezing and medication question.  Symptoms began several days ago.  Interventions attempted: Nothing.  Symptoms are: gradually worsening.  Triage Disposition: Information or Advice Only Call, See Today in Office  Patient/caregiver understands and will follow disposition?: Yes    Reason for Disposition . Caller has medicine question, adult has minor symptoms, caller declines triage, AND triager answers question  Answer Assessment - Initial Assessment Questions 1. NAME of MEDICINE: What medicine(s) are you calling about?     Patient would like to know what she can take for cough-OTC 2. QUESTION: What is your question? (e.g., double dose of medicine, side effect)     Antibiotic should help symptoms- patient advised per cough protocol. Patient will check with pharmacist for recommended OTC treatments with current prescribed medications. Patient has appointment in office Monday and advised if gets worse UC over the weekend.  3. PRESCRIBER: Who prescribed the medicine? Reason: if prescribed by specialist, call should be referred to that group.     [facility] 4. SYMPTOMS: Do you have any symptoms? If Yes, ask: What symptoms are you having?  How bad are the symptoms (e.g., mild, moderate, severe)     OV today- treated for bronchitis- patient requesting advice for cough.  Advised: * Drink warm fluids. Inhale warm mist. This can help relax the airway and also loosen up phlegm. * Suck on cough drops or hard candy to coat the irritated throat.  * COUGH DROPS: Over-the-counter cough drops can help a lot, especially for mild coughs. They soothe an irritated throat and remove the tickle sensation in the back of the throat. Cough drops are easy to carry with you. * COUGH SYRUP WITH  DEXTROMETHORPHAN: An over-the-counter cough syrup can help your cough. The most common cough suppressant in over-the-counter cough medicines is dextromethorphan.  Protocols used: Medication Question Call-A-AH

## 2023-10-10 ENCOUNTER — Ambulatory Visit (INDEPENDENT_AMBULATORY_CARE_PROVIDER_SITE_OTHER)

## 2023-10-10 ENCOUNTER — Ambulatory Visit
Admission: EM | Admit: 2023-10-10 | Discharge: 2023-10-10 | Disposition: A | Discharge status: Home / Self Care | Attending: Nurse Practitioner | Admitting: Nurse Practitioner

## 2023-10-10 DIAGNOSIS — R051 Acute cough: Secondary | ICD-10-CM | POA: Diagnosis not present

## 2023-10-10 DIAGNOSIS — J209 Acute bronchitis, unspecified: Secondary | ICD-10-CM

## 2023-10-10 MED ORDER — MUCINEX DM MAXIMUM STRENGTH 60-1200 MG PO TB12: 1.0000 | ORAL_TABLET | Freq: Two times a day (BID) | ORAL | 0 refills | Status: DC

## 2023-10-10 MED ORDER — HYDROCOD POLI-CHLORPHE POLI ER 10-8 MG/5ML PO SUER: 5.0000 mL | Freq: Every evening | ORAL | 0 refills | Status: DC | PRN

## 2023-10-10 MED ORDER — METHYLPREDNISOLONE 4 MG PO TBPK: ORAL_TABLET | ORAL | 0 refills | Status: DC

## 2023-10-10 MED ORDER — BENZONATATE 200 MG PO CAPS: 200.0000 mg | ORAL_CAPSULE | Freq: Three times a day (TID) | ORAL | 0 refills | Status: DC | PRN

## 2023-10-10 MED ORDER — ALBUTEROL SULFATE HFA 108 (90 BASE) MCG/ACT IN AERS: 2.0000 | INHALATION_SPRAY | RESPIRATORY_TRACT | 0 refills | Status: AC | PRN

## 2023-10-10 MED ORDER — IPRATROPIUM-ALBUTEROL 0.5-2.5 (3) MG/3ML IN SOLN
3.0000 mL | Freq: Once | RESPIRATORY_TRACT | Status: AC
  Administered 2023-10-10: 3 mL via RESPIRATORY_TRACT

## 2023-10-10 NOTE — Discharge Instructions (Signed)
 You were seen today for cough, wheezing, and shortness of breath. Your chest X-ray showed no signs of active infection, pneumonia, or other acute lung or heart problems. In the clinic, you received a breathing treatment. Your overall condition is stable, and you are not in respiratory distress. Your diagnosis is acute bronchitis.  You began a Z-Pak (azithromycin ) two days ago and should complete the entire course, even if you start to feel better. A short course of steroids has been prescribed to help reduce inflammation in your lungs. You should also take Mucinex  DM twice daily to loosen mucus, Tessalon  Perles up to three times daily as needed for cough, and Hycodan at bedtime if cough interferes with sleep. An albuterol  inhaler can be used as needed for episodes of wheezing or shortness of breath.  At home, focus on supportive care. Rest as much as possible, drink plenty of fluids to stay hydrated, and avoid smoking, as this will worsen your recovery and lung health. Using a humidifier or taking a warm shower may help ease congestion and make breathing easier.  Follow up with your primary care provider if your symptoms do not start to improve within the next several days or if you have ongoing breathing difficulties. Go to the emergency department right away if you develop worsening shortness of breath, chest pain, persistent high fever, confusion, or if you feel like you cannot catch your breath.

## 2023-10-10 NOTE — ED Triage Notes (Signed)
 Pt reports congestion, sinus drainage, sneezing and cough x 7 day wheezing and rattling in the chest last night, has to cough had to get up phlegm, soreness on the sides. Was unable to be seen by primary care. Did virtual visit was put on a Z-PAK Friday. Has not helped

## 2023-10-10 NOTE — ED Provider Notes (Signed)
 [facility]    CSN: [id number] Arrival date & time: 10/10/23  1031      History   Chief Complaint No chief complaint on file.   HPI Grabiela Wohlford Elliott is a 63 y.o. female.   Discussed the use of AI scribe software for clinical note transcription with the patient, who gave verbal consent to proceed.   The patient presents with nasal congestion, postnasal drainage, runny nose, sinus pressure, sneezing, cough, shortness of breath, wheezing and headaches.  She denies any chills, fever, sore throat, nausea, vomiting, diarrhea or bodyaches. Symptoms have been ongoing for the past 8-9 days.   The patient had a virtual visit on 10/08/23, where she was diagnosed with bronchitis and prescribed a Z-Pak. She started the antibiotic regimen, taking two pills on the first day and one yesterday, but have not taken any today. For symptom management, the patient has been taking Coricidin due to her history of high blood pressure, which has not provided much relief.   The patient reports chest soreness, particularly when taking deep breaths or coughing, which they attribute to the persistent coughing as well as tightness in the chest. She does not have an inhaler or nebulizer at home. The patient is a smoker but does not vape.  The following portions of the patient's history were reviewed and updated as appropriate: allergies, current medications, past family history, past medical history, past social history, past surgical history, and problem list.     Past Medical History:  Diagnosis Date   Anemia    states low iron   Depression    Hypercholesterolemia    Hypertension    Vitamin D  deficiency     Patient Active Problem List   Diagnosis Date Noted   Overweight 12/11/2022   Protrusion of lumbar intervertebral disc 06/25/2022   Prediabetes 06/12/2022   Chronic bilateral low back pain with right-sided sciatica 06/12/2022   Tobacco use 03/22/2020   Vitamin D  deficiency 04/08/2016    Essential hypertension 04/08/2016   Mixed hyperlipidemia 04/08/2016   Iron deficiency anemia 04/08/2016   Anxiety disorder 02/06/2015   Major depressive disorder with single episode, in full remission (HCC) 02/06/2015    Past Surgical History:  Procedure Laterality Date   BACK SURGERY  2016   BREAST BIOPSY Left    COLONOSCOPY  04/06/2011   Colonic diverticulosis and colonic polyps-removed as described above. Polyps benign. Next colonoscopy 04/2021.   COLONOSCOPY WITH PROPOFOL  N/A 06/04/2021   Procedure: COLONOSCOPY WITH PROPOFOL ;  Surgeon: Shaaron Lamar HERO, MD;  Location: [facility];  Service: Endoscopy;  Laterality: N/A;  9:45am   POLYPECTOMY  06/04/2021   Procedure: POLYPECTOMY;  Surgeon: Shaaron Lamar HERO, MD;  Location: [facility];  Service: Endoscopy;;   TUBAL LIGATION      OB History   No obstetric history on file.      Home Medications    Prior to Admission medications   Medication Sig Start Date End Date Taking? Authorizing Provider  albuterol  (VENTOLIN  HFA) 108 (90 Base) MCG/ACT inhaler Inhale 2 puffs into the lungs every 4 (four) hours as needed for wheezing or shortness of breath (bronchospasms). 10/10/23  Yes Iola Lukes, FNP  benzonatate  (TESSALON ) 200 MG capsule Take 1 capsule (200 mg total) by mouth 3 (three) times daily as needed for cough. 10/10/23  Yes Starkisha Tullis, FNP  chlorpheniramine-HYDROcodone (TUSSIONEX) 10-8 MG/5ML Take 5 mLs by mouth at bedtime as needed for cough. 10/10/23  Yes Reshard Guillet, FNP  Dextromethorphan-guaiFENesin  (MUCINEX  DM  MAXIMUM STRENGTH) 60-1200 MG TB12 Take 1 tablet by mouth 2 (two) times daily. 10/10/23  Yes Iola Lukes, FNP  methylPREDNISolone  (MEDROL  DOSEPAK) 4 MG TBPK tablet Take as directed 10/10/23  Yes Iola Lukes, FNP  acetaminophen (TYLENOL) 500 MG tablet Take 1,000 mg by mouth every 6 (six) hours as needed for moderate pain.    [provider]  Ascorbic Acid (VITAMIN C) 1000 MG tablet Take 1,000  mg by mouth daily.    [provider]  atorvastatin  (LIPITOR) 80 MG tablet Take 1 tablet (80 mg total) by mouth daily. 06/11/23   Joesph Annabella HERO, FNP  azithromycin  (ZITHROMAX ) 250 MG tablet 2 tabs poqday1, 1 tab poqday 2-5 10/08/23   Duanne Butler DASEN, MD  baclofen  (LIORESAL ) 10 MG tablet TAKE 1 TABLET BY MOUTH THREE TIMES A DAY Patient not taking: Reported on 08/11/2023 08/02/23   Joesph Annabella HERO, FNP  cholecalciferol (VITAMIN D3) 25 MCG (1000 UNIT) tablet Take 1,000 Units by mouth daily.    [provider]  hydrocortisone  (ANUSOL -HC) 2.5 % rectal cream Place 1 Application rectally 2 (two) times daily. 08/11/23   Kennedy Charmaine CROME, NP  lisinopril  (ZESTRIL ) 20 MG tablet Take 1 tablet (20 mg total) by mouth daily. 06/11/23   Joesph Annabella HERO, FNP  Omega-3 Fatty Acids (FISH OIL) 500 MG CAPS Take 500 mg by mouth daily.    [provider]  OVER THE COUNTER MEDICATION Take 1 tablet by mouth daily. Nervive otc supplement    [provider]  Turmeric 500 MG CAPS Take 500 mg by mouth daily.    [provider]  vitamin B-12 (CYANOCOBALAMIN ) 500 MCG tablet Take 500 mcg by mouth daily.    [provider]    Family History Family History  Problem Relation Age of Onset   Heart disease Mother    Hypertension Mother    Diabetes Mother    Hypertension Father    Diabetes Sister    Alcohol abuse Brother    Colon cancer Neg Hx    Breast cancer Neg Hx     Social History Social History   Tobacco Use   Smoking status: Every Day    Current packs/day: 0.75    Types: Cigarettes   Smokeless tobacco: Never  Vaping Use   Vaping status: Never Used  Substance Use Topics   Alcohol use: Yes    Comment: social   Drug use: No     Allergies   Patient has no known allergies.   Review of Systems Review of Systems  Constitutional:  Negative for chills and fever.  HENT:  Positive for congestion, postnasal drip, rhinorrhea, sinus pressure and sneezing.  Negative for sore throat.   Respiratory:  Positive for cough, shortness of breath and wheezing.   Gastrointestinal:  Negative for diarrhea, nausea and vomiting.  Musculoskeletal:  Negative for myalgias.  Neurological:  Positive for headaches.  All other systems reviewed and are negative.    Physical Exam Triage Vital Signs ED Triage Vitals  Encounter Vitals Group     BP 10/10/23 1109 (!) 140/89     Girls Systolic BP Percentile --      Girls Diastolic BP Percentile --      Boys Systolic BP Percentile --      Boys Diastolic BP Percentile --      Pulse Rate 10/10/23 1109 94     Resp 10/10/23 1109 16     Temp 10/10/23 1109 98.3 F (36.8 C)  Temp Source 10/10/23 1109 Oral     SpO2 10/10/23 1109 93 %     Weight --      Height --      Head Circumference --      Peak Flow --      Pain Score 10/10/23 1111 6     Pain Loc --      Pain Education --      Exclude from Growth Chart --    No data found.  Updated Vital Signs BP (!) 140/89 (BP Location: Left Arm)   Pulse 94   Temp 98.3 F (36.8 C) (Oral)   Resp 16   LMP 05/10/2011   SpO2 93%   Visual Acuity Right Eye Distance:   Left Eye Distance:   Bilateral Distance:    Right Eye Near:   Left Eye Near:    Bilateral Near:     Physical Exam Vitals reviewed.  Constitutional:      General: She is awake. She is not in acute distress.    Appearance: Normal appearance. She is well-developed. She is not ill-appearing, toxic-appearing or diaphoretic.  HENT:     Head: Normocephalic.     Right Ear: Tympanic membrane, ear canal and external ear normal. No drainage, swelling or tenderness. No middle ear effusion. Tympanic membrane is not erythematous.     Left Ear: Tympanic membrane, ear canal and external ear normal. No drainage, swelling or tenderness.  No middle ear effusion. Tympanic membrane is not erythematous.     Nose: Congestion present. No rhinorrhea.     Mouth/Throat:     Lips: Pink.     Mouth: Mucous membranes are  moist.     Pharynx: No pharyngeal swelling, oropharyngeal exudate, posterior oropharyngeal erythema or uvula swelling.     Tonsils: No tonsillar exudate or tonsillar abscesses.  Eyes:     General: Vision grossly intact.     Conjunctiva/sclera: Conjunctivae normal.  Cardiovascular:     Rate and Rhythm: Normal rate.     Heart sounds: Normal heart sounds.  Pulmonary:     Effort: Pulmonary effort is normal. No tachypnea or respiratory distress.     Breath sounds: Normal air entry. No decreased air movement. Wheezing present.     Comments: Respirations even and unlabored  Musculoskeletal:        General: Normal range of motion.     Cervical back: Normal range of motion and neck supple.  Lymphadenopathy:     Cervical: No cervical adenopathy.  Skin:    General: Skin is warm and dry.  Neurological:     General: No focal deficit present.     Mental Status: She is alert and oriented to person, place, and time.  Psychiatric:        Behavior: Behavior is cooperative.      UC Treatments / Results  Labs (all labs ordered are listed, but only abnormal results are displayed) Labs Reviewed - No data to display  EKG   Radiology DG Chest 2 View Result Date: 10/10/2023 CLINICAL DATA:  Cough with wheezing and shortness of breath. EXAM: CHEST - 2 VIEW COMPARISON:  None Available. FINDINGS: The heart size and mediastinal contours are within normal limits. Mild, diffuse, chronic appearing increased interstitial lung markings are seen. No acute infiltrate, pleural effusion or pneumothorax is identified. The visualized skeletal structures are unremarkable. IMPRESSION: Chronic appearing increased interstitial lung markings without acute or active cardiopulmonary disease. Electronically Signed   By: Suzen Dials M.D.   On:  10/10/2023 11:43    Procedures Procedures (including critical care time)  Medications Ordered in UC Medications  ipratropium-albuterol  (DUONEB) 0.5-2.5 (3) MG/3ML nebulizer  solution 3 mL (3 mLs Nebulization Given 10/10/23 1131)    Initial Impression / Assessment and Plan / UC Course  I have reviewed the triage vital signs and the nursing notes.  Pertinent labs & imaging results that were available during my care of the patient were reviewed by me and considered in my medical decision making (see chart for details).    The patient presents with cough, wheezing, and shortness of breath. Chest X-ray showed chronic appearing increased interstitial lung markings without acute or active cardiopulmonary disease. In clinic, symptoms improved following a Duoneb treatment. The patient is afebrile, nontoxic, with even and unlabored respirations and no acute distress. Clinical impression is acute bronchitis. Patient started ZPACK 2 days ago and should complete this course. Will add a short course of steroids, Mucinex  DM twice daily, Tessalon  Perles three times daily as needed, Tussionex at bedtime as needed, and albuterol  MDI as needed. Supportive measures including rest, hydration as well as smoking cessation were also advised. Patient instructed to follow up with PCP if symptoms do not improve within several days and to seek emergency care for worsening shortness of breath, chest pain, persistent high fever, or other concerning symptoms.  Today's evaluation has revealed no signs of a dangerous process. Discussed diagnosis with patient and/or guardian. Patient and/or guardian aware of their diagnosis, possible red flag symptoms to watch out for and need for close follow up. Patient and/or guardian understands verbal and written discharge instructions. Patient and/or guardian comfortable with plan and disposition.  Patient and/or guardian has a clear mental status at this time, good insight into illness (after discussion and teaching) and has clear judgment to make decisions regarding their care  Documentation was completed with the aid of voice recognition software. Transcription may  contain typographical errors. Final Clinical Impressions(s) / UC Diagnoses   Final diagnoses:  Acute cough  Acute bronchitis, unspecified organism     Discharge Instructions      You were seen today for cough, wheezing, and shortness of breath. Your chest X-ray showed no signs of active infection, pneumonia, or other acute lung or heart problems. In the clinic, you received a breathing treatment. Your overall condition is stable, and you are not in respiratory distress. Your diagnosis is acute bronchitis.  You began a Z-Pak (azithromycin ) two days ago and should complete the entire course, even if you start to feel better. A short course of steroids has been prescribed to help reduce inflammation in your lungs. You should also take Mucinex  DM twice daily to loosen mucus, Tessalon  Perles up to three times daily as needed for cough, and Hycodan at bedtime if cough interferes with sleep. An albuterol  inhaler can be used as needed for episodes of wheezing or shortness of breath.  At home, focus on supportive care. Rest as much as possible, drink plenty of fluids to stay hydrated, and avoid smoking, as this will worsen your recovery and lung health. Using a humidifier or taking a warm shower may help ease congestion and make breathing easier.  Follow up with your primary care provider if your symptoms do not start to improve within the next several days or if you have ongoing breathing difficulties. Go to the emergency department right away if you develop worsening shortness of breath, chest pain, persistent high fever, confusion, or if you feel like you cannot catch  your breath.     ED Prescriptions     Medication Sig Dispense Auth. Provider   albuterol  (VENTOLIN  HFA) 108 (90 Base) MCG/ACT inhaler Inhale 2 puffs into the lungs every 4 (four) hours as needed for wheezing or shortness of breath (bronchospasms). 8.5 g Iola Lukes, FNP   benzonatate  (TESSALON ) 200 MG capsule Take 1 capsule  (200 mg total) by mouth 3 (three) times daily as needed for cough. 30 capsule Iola Lukes, FNP   chlorpheniramine-HYDROcodone (TUSSIONEX) 10-8 MG/5ML Take 5 mLs by mouth at bedtime as needed for cough. 70 mL Iola Lukes, FNP   methylPREDNISolone  (MEDROL  DOSEPAK) 4 MG TBPK tablet Take as directed 21 tablet Eliana Lueth, FNP   Dextromethorphan-guaiFENesin  (MUCINEX  DM MAXIMUM STRENGTH) 60-1200 MG TB12 Take 1 tablet by mouth 2 (two) times daily. 20 tablet Iola Lukes, FNP      I have reviewed the PDMP during this encounter.   Iola Lukes, FNP 10/10/23 1200

## 2023-10-11 ENCOUNTER — Ambulatory Visit: Admitting: Family Medicine

## 2023-10-12 ENCOUNTER — Encounter: Payer: Self-pay | Admitting: Family Medicine

## 2023-10-21 ENCOUNTER — Encounter: Payer: Self-pay | Admitting: Gastroenterology

## 2023-10-22 ENCOUNTER — Ambulatory Visit (INDEPENDENT_AMBULATORY_CARE_PROVIDER_SITE_OTHER): Admitting: Family Medicine

## 2023-10-22 ENCOUNTER — Encounter: Payer: Self-pay | Admitting: Family Medicine

## 2023-10-22 VITALS — BP 124/74 | HR 103 | Temp 98.2°F | Ht 67.0 in | Wt 176.0 lb

## 2023-10-22 DIAGNOSIS — J40 Bronchitis, not specified as acute or chronic: Secondary | ICD-10-CM

## 2023-10-22 NOTE — Progress Notes (Signed)
 Acute Office Visit  Subjective:     Patient ID: Monica Elliott, female    DOB: 16-Jun-1960, 63 y.o.   MRN: [id number]  Chief Complaint  Patient presents with   Recheck bronchitis    HPI  History of Present Illness   Monica Elliott is a 63 year old female who presents for follow-up after being diagnosed with bronchitis two weeks ago.  Cough and respiratory symptoms - Diagnosed with bronchitis two weeks ago and treated with azithromycin  (Z-Pak) - Received a breathing treatment and steroids at urgent care - Chest x-ray at urgent care showed no pneumonia - Currently has a mild cough with minimal clear sputum - Cough has improved since initial diagnosis - No fever, chest pain, or shortness of breath - Wheezing occurred last night but hasn't needed inhaler in days  Nasal congestion - Ongoing nasal congestion since bronchitis diagnosis that is improving       ROS As per HPI.     Objective:    BP 124/74   Pulse (!) 103   Temp 98.2 F (36.8 C) (Temporal)   Ht 5' 7 (1.702 m)   Wt 176 lb (79.8 kg)   LMP 05/10/2011   SpO2 98%   BMI 27.57 kg/m    Physical Exam Vitals and nursing note reviewed.  Constitutional:      General: She is not in acute distress.    Appearance: Normal appearance. She is not ill-appearing, toxic-appearing or diaphoretic.  HENT:     Head: Normocephalic and atraumatic.     Right Ear: Tympanic membrane, ear canal and external ear normal.     Left Ear: Tympanic membrane, ear canal and external ear normal.     Nose: Nose normal.     Mouth/Throat:     Mouth: Mucous membranes are moist.     Pharynx: Oropharynx is clear. No oropharyngeal exudate or posterior oropharyngeal erythema.  Eyes:     General:        Right eye: No discharge.        Left eye: No discharge.     Conjunctiva/sclera: Conjunctivae normal.  Cardiovascular:     Rate and Rhythm: Normal rate and regular rhythm.     Pulses: Normal pulses.     Heart sounds: Normal heart sounds.  No murmur heard. Pulmonary:     Effort: Pulmonary effort is normal. No respiratory distress.     Breath sounds: Normal breath sounds. No wheezing or rales.  Chest:     Chest wall: No tenderness.  Abdominal:     General: Bowel sounds are normal. There is no distension.     Palpations: Abdomen is soft. There is no mass.     Tenderness: There is no abdominal tenderness. There is no guarding or rebound.  Musculoskeletal:     Right lower leg: No edema.     Left lower leg: No edema.  Skin:    General: Skin is warm and dry.  Neurological:     General: No focal deficit present.     Mental Status: She is alert and oriented to person, place, and time.  Psychiatric:        Mood and Affect: Mood normal.        Behavior: Behavior normal.     No results found for any visits on 10/22/23.      Assessment & Plan:   Satrina was seen today for recheck bronchitis.  Diagnoses and all orders for this visit:  Bronchitis  Bronchitis Symptoms improved with mild persistent cough and occasional wheezing. No fever, chest pain, or significant sputum production. Clear lung sounds. - Continue Mucinex  DM for congestion and cough. - Use albuterol  inhaler for wheezing or severe coughing fits. - Return if condition worsens or if increased shortness of breath or wheezing.      The patient indicates understanding of these issues and agrees with the plan.  Monica CHRISTELLA Search, FNP

## 2023-10-28 ENCOUNTER — Other Ambulatory Visit: Payer: Self-pay | Admitting: Family Medicine

## 2023-10-28 DIAGNOSIS — G8929 Other chronic pain: Secondary | ICD-10-CM

## 2023-12-13 ENCOUNTER — Other Ambulatory Visit (INDEPENDENT_AMBULATORY_CARE_PROVIDER_SITE_OTHER)

## 2023-12-13 ENCOUNTER — Encounter: Payer: Self-pay | Admitting: Family Medicine

## 2023-12-13 ENCOUNTER — Ambulatory Visit (INDEPENDENT_AMBULATORY_CARE_PROVIDER_SITE_OTHER): Payer: Self-pay | Admitting: Family Medicine

## 2023-12-13 ENCOUNTER — Other Ambulatory Visit: Payer: Self-pay | Admitting: Family Medicine

## 2023-12-13 VITALS — BP 127/75 | HR 98 | Temp 98.0°F | Ht 67.0 in | Wt 182.0 lb

## 2023-12-13 DIAGNOSIS — Z780 Asymptomatic menopausal state: Secondary | ICD-10-CM | POA: Diagnosis not present

## 2023-12-13 DIAGNOSIS — Z23 Encounter for immunization: Secondary | ICD-10-CM | POA: Diagnosis not present

## 2023-12-13 DIAGNOSIS — Z13820 Encounter for screening for osteoporosis: Secondary | ICD-10-CM

## 2023-12-13 DIAGNOSIS — I10 Essential (primary) hypertension: Secondary | ICD-10-CM | POA: Diagnosis not present

## 2023-12-13 DIAGNOSIS — E782 Mixed hyperlipidemia: Secondary | ICD-10-CM | POA: Diagnosis not present

## 2023-12-13 DIAGNOSIS — R7303 Prediabetes: Secondary | ICD-10-CM | POA: Diagnosis not present

## 2023-12-13 DIAGNOSIS — M5441 Lumbago with sciatica, right side: Secondary | ICD-10-CM

## 2023-12-13 DIAGNOSIS — R7401 Elevation of levels of liver transaminase levels: Secondary | ICD-10-CM

## 2023-12-13 DIAGNOSIS — G8929 Other chronic pain: Secondary | ICD-10-CM

## 2023-12-13 LAB — CMP14+EGFR
ALT: 39 IU/L — ABNORMAL HIGH (ref 0–32)
AST: 24 IU/L (ref 0–40)
Albumin: 4.5 g/dL (ref 3.9–4.9)
Alkaline Phosphatase: 95 IU/L (ref 49–135)
BUN/Creatinine Ratio: 22 (ref 12–28)
BUN: 18 mg/dL (ref 8–27)
Bilirubin Total: 0.3 mg/dL (ref 0.0–1.2)
CO2: 18 mmol/L — ABNORMAL LOW (ref 20–29)
Calcium: 9.6 mg/dL (ref 8.7–10.3)
Chloride: 104 mmol/L (ref 96–106)
Creatinine, Ser: 0.82 mg/dL (ref 0.57–1.00)
Globulin, Total: 2.2 g/dL (ref 1.5–4.5)
Glucose: 98 mg/dL (ref 70–99)
Potassium: 4.3 mmol/L (ref 3.5–5.2)
Sodium: 140 mmol/L (ref 134–144)
Total Protein: 6.7 g/dL (ref 6.0–8.5)
eGFR: 79 mL/min/1.73 (ref >59)

## 2023-12-13 LAB — BAYER DCA HB A1C WAIVED: HB A1C (BAYER DCA - WAIVED): 5.7 % — ABNORMAL HIGH (ref 4.8–5.6)

## 2023-12-13 MED ORDER — CYCLOBENZAPRINE HCL 10 MG PO TABS: 10.0000 mg | ORAL_TABLET | Freq: Three times a day (TID) | ORAL | 3 refills | Status: AC | PRN

## 2023-12-13 NOTE — Progress Notes (Signed)
 Established Patient Office Visit  Subjective   Patient ID: Monica Elliott, female    DOB: [birth year]  Age: 63 y.o. MRN: [id number]  Chief Complaint  Patient presents with   Medical Management of Chronic Issues    HPI  History of Present Illness   Monica Elliott is a 63 year old female who presents for a routine checkup and flu vaccination.  Hyperlipidemia, prediabetes - Compliant with statin - Regular diet - Recent weight gain - No recent exercise  Sciatic nerve pain and sleep disturbance - Intermittent sleep disturbances due to sciatic nerve pain - Persistent numbness and tingling in the right leg - Slight limp, not very noticeable - No changes in bowel or bladder control - Baclofen  previously trialed, caused severe leg cramps - Voltaren , meloxicam, robaxin  previously trialed, ineffective - No satisfactory pain management identified  Ocular symptoms and glaucoma evaluation - Scheduled for evaluation by glaucoma specialist for possible glaucoma in both eyes - No vision problems or eye pain - Occasional scratchy and teary sensation in the right eye - Left eye reportedly worse than right  Hypertension management - Continues lisinopril  for blood pressure control - No chest pain - No shortness of breath  Screening and laboratory monitoring - Due for bone density test - Due for liver enzyme check due to previously slightly elevated liver enzyme - A1c scheduled for monitoring        ROS As per HPI.    Objective:     BP 127/75   Pulse 98   Temp 98 F (36.7 C)   Ht 5' 7 (1.702 m)   Wt 182 lb (82.6 kg)   LMP 05/10/2011   SpO2 95%   BMI 28.51 kg/m  Wt Readings from Last 3 Encounters:  12/13/23 182 lb (82.6 kg)  10/22/23 176 lb (79.8 kg)  08/11/23 177 lb (80.3 kg)      Physical Exam Vitals and nursing note reviewed.  Constitutional:      General: She is not in acute distress.    Appearance: Normal appearance. She is not ill-appearing,  toxic-appearing or diaphoretic.  Cardiovascular:     Rate and Rhythm: Normal rate and regular rhythm.     Pulses: Normal pulses.     Heart sounds: Normal heart sounds. No murmur heard. Pulmonary:     Effort: Pulmonary effort is normal. No respiratory distress.     Breath sounds: Normal breath sounds.  Musculoskeletal:     Right lower leg: No edema.     Left lower leg: No edema.  Skin:    General: Skin is warm and dry.  Neurological:     General: No focal deficit present.     Mental Status: She is alert and oriented to person, place, and time.  Psychiatric:        Mood and Affect: Mood normal.        Behavior: Behavior normal.      No results found for any visits on 12/13/23.    The 10-year ASCVD risk score (Arnett DK, et al., 2019) is: 14.5%    Assessment & Plan:   Anays was seen today for medical management of chronic issues.  Diagnoses and all orders for this visit:  Prediabetes -     Bayer DCA Hb A1c Waived  Primary hypertension -     CMP14+EGFR  Mixed hyperlipidemia  Elevated ALT measurement -     CMP14+EGFR  Chronic bilateral low back pain with right-sided sciatica -  cyclobenzaprine (FLEXERIL) 10 MG tablet; Take 1 tablet (10 mg total) by mouth 3 (three) times daily as needed for muscle spasms.  Postmenopausal  Need for immunization against influenza   Assessment and Plan    Hypertension Blood pressure well-controlled on lisinopril . - Continue lisinopril .  Mixed hyperlipidemia Emphasized healthy diet for cholesterol management. - Encouraged healthy diet, exercise. Continue statin.   Prediabetes A1c to be checked for glucose monitoring. - Ordered A1c test.  Chronic low back pain with right sided sciatica Previous treatments ineffective or adverse. Considering Flexeril with caution for drowsiness. - Prescribed Flexeril to trial  Elevation of liver transaminase levels Plan to recheck liver enzymes. - Ordered liver enzyme  test.   Postmenopausal - DEXA today  General Health Maintenance Discussed flu shot benefits and importance of diet and exercise. - Administered flu shot. - Encouraged healthy diet and exercise.       Return in about 6 months (around 06/11/2024) for CPE.   The patient indicates understanding of these issues and agrees with the plan.  Monica CHRISTELLA Search, FNP

## 2023-12-15 ENCOUNTER — Ambulatory Visit: Payer: Self-pay | Admitting: Family Medicine

## 2023-12-15 ENCOUNTER — Telehealth: Payer: Self-pay

## 2023-12-15 NOTE — Telephone Encounter (Signed)
 Result encounter updated

## 2023-12-15 NOTE — Telephone Encounter (Signed)
 Copied from CRM [id number]. Topic: Clinical - Lab/Test Results >> Dec 15, 2023  4:25 PM Avram MATSU wrote: Reason for CRM: I relayed the test results to the patient

## 2023-12-20 ENCOUNTER — Ambulatory Visit: Payer: Self-pay | Admitting: Family Medicine

## 2024-02-14 ENCOUNTER — Ambulatory Visit: Payer: Self-pay | Admitting: Family Medicine

## 2024-02-14 ENCOUNTER — Encounter: Payer: Self-pay | Admitting: Family Medicine

## 2024-02-14 VITALS — BP 121/80 | HR 98 | Temp 97.7°F | Ht 67.0 in | Wt 186.4 lb

## 2024-02-14 DIAGNOSIS — Z0000 Encounter for general adult medical examination without abnormal findings: Secondary | ICD-10-CM

## 2024-02-14 NOTE — Progress Notes (Signed)
 "  Chief Complaint  Patient presents with   Medicare Wellness     Subjective:   Monica Elliott is a 64 y.o. female who presents for a Welcome to Medicare Exam.   Visit info / Clinical Intake: Interpreter Needed?: No Living arrangements:: (Patient-Rptd) with family/others Patient's Overall Health Status Rating: (!) (Patient-Rptd) fair Typical amount of pain: (!) (Patient-Rptd) a lot Does pain affect daily life?: (Patient-Rptd) no  Dietary Habits and Nutritional Risks How many meals a day?: (Patient-Rptd) 2 Eats fruit and vegetables daily?: (!) (Patient-Rptd) no Most meals are obtained by: (Patient-Rptd) preparing own meals  Functional Status Activities of Daily Living (to include ambulation/medication): Independent Ambulation: Independent Medication Administration: Independent Home Management (perform basic housework or laundry): Independent Manage your own finances?: (Patient-Rptd) yes Primary transportation is: (Patient-Rptd) driving  Fall Screening Falls in the past year?: (Patient-Rptd) 0 Number of falls in past year: 0 Was there an injury with Fall?: 0 Fall Risk Category Calculator: 0 Patient Fall Risk Level: Low Fall Risk  Fall Risk Patient at Risk for Falls Due to: No Fall Risks Fall risk Follow up: Falls evaluation completed  Home and Transportation Safety: All rugs have non-skid backing?: (Patient-Rptd) N/A, no rugs All stairs or steps have railings?: (Patient-Rptd) N/A, no stairs Grab bars in the bathtub or shower?: (!) (Patient-Rptd) no Have non-skid surface in bathtub or shower?: (Patient-Rptd) yes Good home lighting?: (Patient-Rptd) yes Regular seat belt use?: (Patient-Rptd) yes Hospital stays in the last year:: (Patient-Rptd) no  Cognitive Assessment Difficulty concentrating, remembering, or making decisions? : (Patient-Rptd) no    Allergies (verified) Patient has no known allergies.   Current Medications (verified) Outpatient Encounter  Medications as of 02/14/2024  Medication Sig   acetaminophen (TYLENOL) 500 MG tablet Take 1,000 mg by mouth every 6 (six) hours as needed for moderate pain.   albuterol  (VENTOLIN  HFA) 108 (90 Base) MCG/ACT inhaler Inhale 2 puffs into the lungs every 4 (four) hours as needed for wheezing or shortness of breath (bronchospasms).   Ascorbic Acid (VITAMIN C) 1000 MG tablet Take 1,000 mg by mouth daily.   atorvastatin  (LIPITOR) 80 MG tablet Take 1 tablet (80 mg total) by mouth daily.   cholecalciferol (VITAMIN D3) 25 MCG (1000 UNIT) tablet Take 1,000 Units by mouth daily.   cyclobenzaprine  (FLEXERIL ) 10 MG tablet Take 1 tablet (10 mg total) by mouth 3 (three) times daily as needed for muscle spasms.   hydrocortisone  (ANUSOL -HC) 2.5 % rectal cream Place 1 Application rectally 2 (two) times daily.   lisinopril  (ZESTRIL ) 20 MG tablet Take 1 tablet (20 mg total) by mouth daily.   Omega-3 Fatty Acids (FISH OIL) 500 MG CAPS Take 500 mg by mouth daily.   OVER THE COUNTER MEDICATION Take 1 tablet by mouth daily. Nervive otc supplement   prednisoLONE acetate (PRED FORTE) 1 % ophthalmic suspension Instill 1 drop into right eye  Taper as directed one drop 4 times a day for 3 days, then 2 times a day for 2 days, then STOP   Turmeric 500 MG CAPS Take 500 mg by mouth daily.   vitamin B-12 (CYANOCOBALAMIN ) 500 MCG tablet Take 500 mcg by mouth daily.   No facility-administered encounter medications on file as of 02/14/2024.    History: Past Medical History:  Diagnosis Date   Anemia    states low iron   Arthritis    Depression    Glaucoma    Hypercholesterolemia    Hypertension    Vitamin D  deficiency  Past Surgical History:  Procedure Laterality Date   BACK SURGERY  2016   BREAST BIOPSY Left    COLONOSCOPY  04/06/2011   Colonic diverticulosis and colonic polyps-removed as described above. Polyps benign. Next colonoscopy 04/2021.   COLONOSCOPY WITH PROPOFOL  N/A 06/04/2021   Procedure: COLONOSCOPY WITH  PROPOFOL ;  Surgeon: Shaaron Lamar HERO, MD;  Location: [facility];  Service: Endoscopy;  Laterality: N/A;  9:45am   POLYPECTOMY  06/04/2021   Procedure: POLYPECTOMY;  Surgeon: Shaaron Lamar HERO, MD;  Location: [facility];  Service: Endoscopy;;   SPINE SURGERY     TUBAL LIGATION     Family History  Problem Relation Age of Onset   Heart disease Mother    Hypertension Mother    Diabetes Mother    Hypertension Father    Diabetes Sister    Arthritis Sister    Alcohol abuse Brother    Colon cancer Neg Hx    Breast cancer Neg Hx    Social History   Occupational History   Not on file  Tobacco Use   Smoking status: Every Day    Current packs/day: 0.75    Types: Cigarettes   Smokeless tobacco: Never  Vaping Use   Vaping status: Never Used  Substance and Sexual Activity   Alcohol use: Yes    Comment: social   Drug use: No   Sexual activity: Not Currently   Tobacco Counseling Ready to quit: Not Answered Counseling given: Not Answered  SDOH Screenings   Food Insecurity: No Food Insecurity (02/14/2024)  Housing: Low Risk (02/14/2024)  Transportation Needs: No Transportation Needs (02/14/2024)  Utilities: Not At Risk (02/14/2024)  Alcohol Screen: Low Risk (12/09/2023)  Depression (PHQ2-9): Low Risk (02/14/2024)  Financial Resource Strain: Low Risk (12/09/2023)  Physical Activity: Inactive (02/14/2024)  Social Connections: Moderately Integrated (02/14/2024)  Recent Concern: Social Connections - Moderately Isolated (12/09/2023)  Stress: No Stress Concern Present (12/09/2023)  Tobacco Use: High Risk (02/14/2024)   See flowsheets for full screening details  Depression Screen PHQ 2 & 9 Depression Scale- Over the past 2 weeks, how often have you been bothered by any of the following problems? Little interest or pleasure in doing things: 0 Feeling down, depressed, or hopeless (PHQ Adolescent also includes...irritable): 0 PHQ-2 Total Score: 0 Trouble falling or staying asleep, or sleeping  too much: 1 Feeling tired or having little energy: 0 Poor appetite or overeating (PHQ Adolescent also includes...weight loss): 1 Feeling bad about yourself - or that you are a failure or have let yourself or your family down: 0 Trouble concentrating on things, such as reading the newspaper or watching television (PHQ Adolescent also includes...like school work): 0 Moving or speaking so slowly that other people could have noticed. Or the opposite - being so fidgety or restless that you have been moving around a lot more than usual: 0 Thoughts that you would be better off dead, or of hurting yourself in some way: 0 PHQ-9 Total Score: 2 If you checked off any problems, how difficult have these problems made it for you to do your work, take care of things at home, or get along with other people?: Not difficult at all  Depression Treatment Depression Interventions/Treatment : [id number] Score <4 Follow-up Not Indicated      Goals Addressed               This Visit's Progress     Exercise 150 min/wk Moderate Activity (pt-stated)        I would  like to exercise and eat healthier to lose weight             Objective:    Today's Vitals   02/14/24 0847  BP: 121/80  Pulse: 98  Temp: 97.7 F (36.5 C)  TempSrc: Temporal  SpO2: 96%  Weight: 186 lb 6.4 oz (84.6 kg)  Height: 5' 7 (1.702 m)   Body mass index is 29.19 kg/m.   Wt Readings from Last 3 Encounters:  02/14/24 186 lb 6.4 oz (84.6 kg)  12/13/23 182 lb (82.6 kg)  10/22/23 176 lb (79.8 kg)    Physical Exam Vitals and nursing note reviewed.  Constitutional:      General: She is not in acute distress.    Appearance: She is not ill-appearing, toxic-appearing or diaphoretic.  Pulmonary:     Effort: Pulmonary effort is normal. No respiratory distress.  Neurological:     Mental Status: She is alert and oriented to person, place, and time. Mental status is at baseline.  Psychiatric:        Mood and Affect: Mood normal.         Behavior: Behavior normal.       Hearing/Vision screen No results found. Immunizations and Health Maintenance Health Maintenance  Topic Date Due   Zoster Vaccines- Shingrix (1 of 2) 09/10/2024 (Originally 07/04/2008)   Pneumococcal Vaccine: 50+ Years (1 of 2 - PCV) 12/12/2024 (Originally [birth year]1977)   HIV Screening  12/12/2024 (Originally 07/04/1975)   COVID-19 Vaccine (5 - 2025-26 season) 03/01/2025 (Originally 10/04/2023)   Medicare Annual Wellness (AWV)  02/13/2025   Cervical Cancer Screening (HPV/Pap Cotest)  03/22/2025   Mammogram  07/11/2025   Bone Density Scan  12/16/2025   DTaP/Tdap/Td (2 - Td or Tdap) 03/22/2030   Colonoscopy  06/05/2031   Influenza Vaccine  Completed   Hepatitis C Screening  Completed   Hepatitis B Vaccines 19-59 Average Risk  Aged Out   Meningococcal B Vaccine  Aged Out    EKG: normal EKG, normal sinus rhythm, there are no previous tracings available for comparison     Assessment/Plan:  This is a routine wellness examination for [place].  Patient Care Team: Joesph Annabella HERO, FNP as PCP - General (Family Medicine) Shaaron Lamar HERO, MD (Gastroenterology) Joshua Mercy CROME, NT as Technician (Internal Medicine)  I have personally reviewed and noted the following in the patients chart:   Medical and social history Use of alcohol, tobacco or illicit drugs  Current medications and supplements including opioid prescriptions. Functional ability and status Nutritional status Physical activity Advanced directives List of other physicians Hospitalizations, surgeries, and ER visits in previous 12 months Vitals Screenings to include cognitive, depression, and falls Referrals and appointments  Orders Placed This Encounter  Procedures   EKG 12-Lead   She declined pneumonia, shingles vaccines today. DEclines HIV screening today.   In addition, I have reviewed and discussed with patient certain preventive protocols, quality metrics, and best practice  recommendations. A written personalized care plan for preventive services as well as general preventive health recommendations were provided to patient.   Annabella HERO Joesph, FNP   02/14/2024   Keep scheduled chronic follow up.  "

## 2024-06-22 ENCOUNTER — Encounter: Admitting: Family Medicine
